# Patient Record
Sex: Female | Born: 1965
Health system: Southern US, Community
[De-identification: ages and names within clinical notes are randomized; demographics above are authoritative.]

## PROBLEM LIST (undated history)

## (undated) DIAGNOSIS — I10 Essential (primary) hypertension: Secondary | ICD-10-CM

## (undated) DIAGNOSIS — Z5189 Encounter for other specified aftercare: Secondary | ICD-10-CM

## (undated) DIAGNOSIS — M8580 Other specified disorders of bone density and structure, unspecified site: Secondary | ICD-10-CM

## (undated) DIAGNOSIS — Z9889 Other specified postprocedural states: Secondary | ICD-10-CM

## (undated) DIAGNOSIS — F419 Anxiety disorder, unspecified: Secondary | ICD-10-CM

## (undated) DIAGNOSIS — E079 Disorder of thyroid, unspecified: Secondary | ICD-10-CM

## (undated) DIAGNOSIS — G43109 Migraine with aura, not intractable, without status migrainosus: Secondary | ICD-10-CM

## (undated) HISTORY — PX: BREAST SURGERY: SHX581

## (undated) HISTORY — DX: Encounter for other specified aftercare: Z51.89

## (undated) HISTORY — DX: Disorder of thyroid, unspecified: E07.9

## (undated) HISTORY — DX: Other specified disorders of bone density and structure, unspecified site: M85.80

## (undated) HISTORY — PX: REDUCTION MAMMAPLASTY: SUR839

## (undated) HISTORY — DX: Other specified postprocedural states: Z98.890

## (undated) HISTORY — DX: Essential (primary) hypertension: I10

## (undated) HISTORY — DX: Migraine with aura, not intractable, without status migrainosus: G43.109

## (undated) HISTORY — DX: Anxiety disorder, unspecified: F41.9

## (undated) HISTORY — PX: ABDOMINAL HYSTERECTOMY: SHX81

---

## 1996-08-16 HISTORY — PX: BREAST LUMPECTOMY: SHX2

## 1999-03-05 ENCOUNTER — Other Ambulatory Visit: Admission: RE | Admit: 1999-03-05 | Discharge: 1999-03-05 | Payer: Self-pay | Admitting: Obstetrics and Gynecology

## 2000-02-09 ENCOUNTER — Other Ambulatory Visit: Admission: RE | Admit: 2000-02-09 | Discharge: 2000-02-09 | Payer: Self-pay | Admitting: Obstetrics and Gynecology

## 2000-09-16 ENCOUNTER — Inpatient Hospital Stay (HOSPITAL_COMMUNITY): Admission: AD | Admit: 2000-09-16 | Discharge: 2000-09-20 | Payer: Self-pay | Admitting: Obstetrics and Gynecology

## 2000-09-21 ENCOUNTER — Encounter: Admission: RE | Admit: 2000-09-21 | Discharge: 2000-10-21 | Payer: Self-pay | Admitting: Obstetrics and Gynecology

## 2001-02-15 ENCOUNTER — Other Ambulatory Visit: Admission: RE | Admit: 2001-02-15 | Discharge: 2001-02-15 | Payer: Self-pay | Admitting: Obstetrics and Gynecology

## 2002-01-30 ENCOUNTER — Other Ambulatory Visit: Admission: RE | Admit: 2002-01-30 | Discharge: 2002-01-30 | Payer: Self-pay | Admitting: Obstetrics and Gynecology

## 2003-02-22 ENCOUNTER — Other Ambulatory Visit: Admission: RE | Admit: 2003-02-22 | Discharge: 2003-02-22 | Payer: Self-pay | Admitting: Obstetrics and Gynecology

## 2004-01-30 ENCOUNTER — Encounter (INDEPENDENT_AMBULATORY_CARE_PROVIDER_SITE_OTHER): Payer: Self-pay | Admitting: *Deleted

## 2004-01-30 ENCOUNTER — Ambulatory Visit (HOSPITAL_COMMUNITY): Admission: RE | Admit: 2004-01-30 | Discharge: 2004-01-30 | Payer: Self-pay | Admitting: Obstetrics and Gynecology

## 2004-01-30 ENCOUNTER — Ambulatory Visit (HOSPITAL_BASED_OUTPATIENT_CLINIC_OR_DEPARTMENT_OTHER): Admission: RE | Admit: 2004-01-30 | Discharge: 2004-01-30 | Payer: Self-pay | Admitting: Obstetrics and Gynecology

## 2004-04-30 ENCOUNTER — Other Ambulatory Visit: Admission: RE | Admit: 2004-04-30 | Discharge: 2004-04-30 | Payer: Self-pay | Admitting: Obstetrics and Gynecology

## 2005-01-20 ENCOUNTER — Ambulatory Visit: Payer: Self-pay | Admitting: Internal Medicine

## 2005-03-08 ENCOUNTER — Ambulatory Visit: Payer: Self-pay | Admitting: Internal Medicine

## 2005-05-27 ENCOUNTER — Other Ambulatory Visit: Admission: RE | Admit: 2005-05-27 | Discharge: 2005-05-27 | Payer: Self-pay | Admitting: Obstetrics and Gynecology

## 2005-08-12 ENCOUNTER — Ambulatory Visit: Payer: Self-pay | Admitting: Internal Medicine

## 2005-08-20 ENCOUNTER — Ambulatory Visit: Payer: Self-pay | Admitting: Internal Medicine

## 2005-08-27 ENCOUNTER — Ambulatory Visit: Payer: Self-pay | Admitting: Internal Medicine

## 2005-11-02 ENCOUNTER — Ambulatory Visit: Payer: Self-pay | Admitting: Internal Medicine

## 2005-12-24 ENCOUNTER — Ambulatory Visit: Payer: Self-pay | Admitting: Family Medicine

## 2006-07-25 ENCOUNTER — Ambulatory Visit: Payer: Self-pay | Admitting: Internal Medicine

## 2006-08-25 ENCOUNTER — Encounter (INDEPENDENT_AMBULATORY_CARE_PROVIDER_SITE_OTHER): Payer: Self-pay | Admitting: Specialist

## 2006-08-25 ENCOUNTER — Ambulatory Visit (HOSPITAL_COMMUNITY): Admission: RE | Admit: 2006-08-25 | Discharge: 2006-08-25 | Payer: Self-pay | Admitting: Obstetrics and Gynecology

## 2006-10-17 ENCOUNTER — Ambulatory Visit: Payer: Self-pay | Admitting: Internal Medicine

## 2007-05-01 ENCOUNTER — Ambulatory Visit: Payer: Self-pay | Admitting: Internal Medicine

## 2007-05-01 LAB — CONVERTED CEMR LAB: Beta hcg, urine, semiquantitative: NEGATIVE

## 2007-05-04 ENCOUNTER — Telehealth (INDEPENDENT_AMBULATORY_CARE_PROVIDER_SITE_OTHER): Payer: Self-pay | Admitting: *Deleted

## 2007-10-19 ENCOUNTER — Ambulatory Visit: Payer: Self-pay | Admitting: Internal Medicine

## 2007-11-08 ENCOUNTER — Telehealth: Payer: Self-pay | Admitting: Internal Medicine

## 2007-11-09 ENCOUNTER — Ambulatory Visit: Payer: Self-pay | Admitting: Internal Medicine

## 2007-11-27 ENCOUNTER — Telehealth (INDEPENDENT_AMBULATORY_CARE_PROVIDER_SITE_OTHER): Payer: Self-pay | Admitting: *Deleted

## 2008-10-14 ENCOUNTER — Inpatient Hospital Stay (HOSPITAL_COMMUNITY): Admission: AD | Admit: 2008-10-14 | Discharge: 2008-10-17 | Payer: Self-pay | Admitting: Obstetrics and Gynecology

## 2008-10-15 ENCOUNTER — Encounter: Payer: Self-pay | Admitting: Obstetrics and Gynecology

## 2008-12-03 ENCOUNTER — Encounter: Admission: RE | Admit: 2008-12-03 | Discharge: 2008-12-12 | Payer: Self-pay | Admitting: Obstetrics and Gynecology

## 2009-02-12 ENCOUNTER — Ambulatory Visit: Payer: Self-pay | Admitting: Internal Medicine

## 2009-02-12 DIAGNOSIS — I10 Essential (primary) hypertension: Secondary | ICD-10-CM | POA: Insufficient documentation

## 2009-02-14 ENCOUNTER — Ambulatory Visit: Payer: Self-pay | Admitting: Internal Medicine

## 2009-02-14 DIAGNOSIS — L0201 Cutaneous abscess of face: Secondary | ICD-10-CM | POA: Insufficient documentation

## 2009-02-14 DIAGNOSIS — L03211 Cellulitis of face: Secondary | ICD-10-CM

## 2009-04-22 ENCOUNTER — Ambulatory Visit: Payer: Self-pay | Admitting: Internal Medicine

## 2009-04-22 ENCOUNTER — Encounter (INDEPENDENT_AMBULATORY_CARE_PROVIDER_SITE_OTHER): Payer: Self-pay | Admitting: *Deleted

## 2009-04-22 DIAGNOSIS — J019 Acute sinusitis, unspecified: Secondary | ICD-10-CM | POA: Insufficient documentation

## 2009-05-30 ENCOUNTER — Ambulatory Visit: Payer: Self-pay | Admitting: Internal Medicine

## 2009-07-14 ENCOUNTER — Encounter: Payer: Self-pay | Admitting: Internal Medicine

## 2009-07-23 ENCOUNTER — Telehealth (INDEPENDENT_AMBULATORY_CARE_PROVIDER_SITE_OTHER): Payer: Self-pay | Admitting: *Deleted

## 2009-08-16 HISTORY — PX: TOTAL THYROIDECTOMY: SHX2547

## 2009-11-06 ENCOUNTER — Ambulatory Visit: Payer: Self-pay | Admitting: Internal Medicine

## 2009-11-13 ENCOUNTER — Encounter: Admission: RE | Admit: 2009-11-13 | Discharge: 2009-11-13 | Payer: Self-pay | Admitting: Emergency Medicine

## 2009-11-18 ENCOUNTER — Other Ambulatory Visit: Admission: RE | Admit: 2009-11-18 | Discharge: 2009-11-18 | Payer: Self-pay | Admitting: Interventional Radiology

## 2009-11-18 ENCOUNTER — Encounter: Admission: RE | Admit: 2009-11-18 | Discharge: 2009-11-18 | Payer: Self-pay | Admitting: Emergency Medicine

## 2009-12-04 ENCOUNTER — Encounter: Payer: Self-pay | Admitting: Internal Medicine

## 2009-12-17 ENCOUNTER — Encounter: Admission: RE | Admit: 2009-12-17 | Discharge: 2009-12-17 | Payer: Self-pay | Admitting: Surgery

## 2009-12-17 ENCOUNTER — Other Ambulatory Visit: Admission: RE | Admit: 2009-12-17 | Discharge: 2009-12-17 | Payer: Self-pay | Admitting: Interventional Radiology

## 2010-04-09 ENCOUNTER — Encounter (INDEPENDENT_AMBULATORY_CARE_PROVIDER_SITE_OTHER): Payer: Self-pay | Admitting: Surgery

## 2010-04-09 ENCOUNTER — Observation Stay (HOSPITAL_COMMUNITY): Admission: RE | Admit: 2010-04-09 | Discharge: 2010-04-11 | Payer: Self-pay | Admitting: Surgery

## 2010-06-11 ENCOUNTER — Ambulatory Visit: Payer: Self-pay | Admitting: Internal Medicine

## 2010-09-15 NOTE — Assessment & Plan Note (Signed)
Summary: FLU SHOT/RH........  Nurse Visit   Allergies: 1)  ! Codeine  Orders Added: 1)  Admin 1st Vaccine [90471] 2)  Flu Vaccine 107yrs + [84696] Flu Vaccine Consent Questions     Do you have a history of severe allergic reactions to this vaccine? no    Any prior history of allergic reactions to egg and/or gelatin? no    Do you have a sensitivity to the preservative Thimersol? no    Do you have a past history of Guillan-Barre Syndrome? no    Do you currently have an acute febrile illness? no    Have you ever had a severe reaction to latex? no    Vaccine information given and explained to patient? yes    Are you currently pregnant? no    Lot Number:[id number]   Exp Date:02/13/2011   Site Given  Left Deltoid IM

## 2010-09-15 NOTE — Assessment & Plan Note (Signed)
Summary: sinus infection//lch   Vital Signs:  Patient profile:   45 year old female Weight:      129.8 pounds Temp:     98.3 degrees F oral Pulse rate:   84 / minute Resp:     14 per minute BP sitting:   120 / 72  (left arm)  Vitals Entered By: Shonna Chock (November 06, 2009 12:21 PM) CC: Sinus Infection: cough (productive-discolored), head feels full, congestion, and achy since Sunday Comments REVIEWED MED LIST, PATIENT AGREED DOSE AND INSTRUCTION CORRECT    CC:  Sinus Infection: cough (productive-discolored), head feels full, congestion, and and achy since Sunday.  History of Present Illness: Onset since 11/02/2009 of head congestion with chills & am cough with thick , green sputum. Rx: NSAIDS. Flu shot in Fall  Allergies: 1)  ! Codeine  Review of Systems General:  Denies fever and sweats. ENT:  Complains of earache, nasal congestion, and sinus pressure; denies ear discharge and sore throat; Facial pain , frontal headaches with clear D/C. ST improved. Resp:  Denies chest pain with inspiration, coughing up blood, shortness of breath, and wheezing. GU:  Denies discharge and dysuria; Suprapubic tenderness with urination.  Physical Exam  General:  Thin,well-nourished,in no acute distress; alert,appropriate and cooperative throughout examination Ears:  External ear exam shows no significant lesions or deformities.  Otoscopic examination reveals clear canals, tympanic membranes are intact bilaterally without bulging, retraction, inflammation or discharge. Hearing is grossly normal bilaterally. Nose:  External nasal examination shows no deformity or inflammation. Nasal mucosa are pink and moist without lesions or exudates.Hyponasal speech Mouth:  Oral mucosa and oropharynx without lesions or exudates.  Teeth in good repair. Lungs:  Normal respiratory effort, chest expands symmetrically. Lungs are clear to auscultation, no crackles or wheezes. Heart:  Normal rate and regular rhythm. S1  and S2 normal without gallop, murmur, click, rub .S4 Cervical Nodes:  Shotty post LA  Axillary Nodes:  No palpable lymphadenopathy   Impression & Recommendations:  Problem # 1:  SINUSITIS- ACUTE-NOS (ICD-461.9)  The following medications were removed from the medication list:    Clarithromycin 500 Mg Xr24h-tab (Clarithromycin) .Marland Kitchen... 2 once daily with food Her updated medication list for this problem includes:    Smz-tmp Ds 800-160 Mg Tabs (Sulfamethoxazole-trimethoprim) .Marland Kitchen... 1 two times a day with 8 oz water    Fluticasone Propionate 50 Mcg/act Susp (Fluticasone propionate) .Marland Kitchen... 1 spray two times a day  Problem # 2:  BRONCHITIS-ACUTE (ICD-466.0)  The following medications were removed from the medication list:    Clarithromycin 500 Mg Xr24h-tab (Clarithromycin) .Marland Kitchen... 2 once daily with food Her updated medication list for this problem includes:    Smz-tmp Ds 800-160 Mg Tabs (Sulfamethoxazole-trimethoprim) .Marland Kitchen... 1 two times a day with 8 oz water  Complete Medication List: 1)  Multi-vitamin 1 Tab Qd  2)  Chew Calicum 1 Qd  3)  Xanax 0.5 Mg Tabs (Alprazolam) .... 1/2-1 by mouth at bedtime 4)  Smz-tmp Ds 800-160 Mg Tabs (Sulfamethoxazole-trimethoprim) .Marland Kitchen.. 1 two times a day with 8 oz water 5)  Fluticasone Propionate 50 Mcg/act Susp (Fluticasone propionate) .Marland Kitchen.. 1 spray two times a day  Patient Instructions: 1)  Neti pot once daily until sinuses clear. 2)  Drink as much fluid as you can tolerate for the next few days. Prescriptions: FLUTICASONE PROPIONATE 50 MCG/ACT SUSP (FLUTICASONE PROPIONATE) 1 spray two times a day  #1 x 5   Entered and Authorized by:   Marga Melnick MD   Signed by:  Marga Melnick MD on 11/06/2009   Method used:   Faxed to ...       [facility] [id number]* (retail)       [address] [place], [place]  [address]       Ph: [phone]       Fax: [phone]   RxID:   [id number] SMZ-TMP DS 800-160 MG TABS (SULFAMETHOXAZOLE-TRIMETHOPRIM) 1  two times a day with 8 oz water  #20 x 0   Entered and Authorized by:   Marga Melnick MD   Signed by:   Marga Melnick MD on 11/06/2009   Method used:   Faxed to ...       [facility] [address] #[id number]* (retail)       [address] [place], [place]  [address]       Ph: [phone]       Fax: [phone]   RxID:   [id number]

## 2010-09-15 NOTE — Letter (Signed)
Summary: [facility]   Imported By: Lanelle Bal 12/23/2009 13:47:50  _____________________________________________________________________  External Attachment:    Type:   Image     Comment:   External Document  Appended Document: [facility] I reviewed Path reports; benign thyroid adenoma . Benignity verified by Specialist in [place].

## 2010-10-30 LAB — COMPREHENSIVE METABOLIC PANEL
ALT: 21 U/L (ref 0–35)
AST: 20 U/L (ref 0–37)
Albumin: 4.4 g/dL (ref 3.5–5.2)
BUN: 10 mg/dL (ref 6–23)
GFR calc Af Amer: >60 mL/min (ref >60)
GFR calc non Af Amer: >60 mL/min (ref >60)
Glucose, Bld: 56 mg/dL — ABNORMAL LOW (ref 70–99)
Potassium: 3.9 mEq/L (ref 3.5–5.1)
Total Protein: 7.1 g/dL (ref 6.0–8.3)

## 2010-10-30 LAB — CBC
HCT: 43.3 % (ref 36.0–46.0)
Hemoglobin: 14.7 g/dL (ref 12.0–15.0)
MCH: 28.8 pg (ref 26.0–34.0)
MCHC: 33.9 g/dL (ref 30.0–36.0)
MCV: 85 fL (ref 78.0–100.0)
Platelets: 163 10*3/uL (ref 150–400)

## 2010-10-30 LAB — DIFFERENTIAL
Lymphocytes Relative: 19 % (ref 12–46)
Lymphs Abs: 1.6 10*3/uL (ref 0.7–4.0)
Monocytes Relative: 4 % (ref 3–12)

## 2010-10-30 LAB — CALCIUM: Calcium: 8.3 mg/dL — ABNORMAL LOW (ref 8.4–10.5)

## 2010-11-26 LAB — CBC
HCT: 36.8 % (ref 36.0–46.0)
Hemoglobin: 12.5 g/dL (ref 12.0–15.0)
MCHC: 34 g/dL (ref 30.0–36.0)
MCV: 90.6 fL (ref 78.0–100.0)
Platelets: 182 10*3/uL (ref 150–400)
RBC: 4.07 MIL/uL (ref 3.87–5.11)

## 2010-11-26 LAB — RPR: RPR Ser Ql: NONREACTIVE

## 2010-11-26 LAB — TYPE AND SCREEN: ABO/RH(D): A POS

## 2010-11-26 LAB — ABO/RH: ABO/RH(D): A POS

## 2010-12-29 NOTE — H&P (Signed)
Olivia Page, Olivia Page              ACCOUNT NO.:  [id number]   MEDICAL RECORD NO.:  [id number]        PATIENT TYPE:  WINP   LOCATION:                                [facility]   PHYSICIAN:  Guy Sandifer. Henderson Cloud, M.D.      DATE OF BIRTH:   DATE OF ADMISSION:  10/14/2008  DATE OF DISCHARGE:                              HISTORY & PHYSICAL   CHIEF COMPLAINT:  Placenta previa with bleeding.   HISTORY OF PRESENT ILLNESS:  The patient is a 45 year old married white  female G2, P18 with an EDC of November 17, 2008 consistent with    Dictation ends here, incomplete   Finished in separate dictation      Fayrene Fearing E. Henderson Cloud, M.D.  Electronically Signed     JET/MEDQ  D:  10/14/2008  T:  10/14/2008  Job:  [id number]

## 2010-12-29 NOTE — H&P (Signed)
Olivia Page, Olivia Page              ACCOUNT NO.:  [id number]   MEDICAL RECORD NO.:  [id number]          PATIENT TYPE:  INP   LOCATION:  9156                          [facility]ACILITY:  [facility]   PHYSICIAN:  Guy Sandifer. Henderson Cloud, M.D. DATE OF BIRTH:  1966-02-20   DATE OF ADMISSION:  10/14/2008  DATE OF DISCHARGE:                              HISTORY & PHYSICAL   CHIEF COMPLAINT:  Placenta previa.   HISTORY OF PRESENT ILLNESS:  This patient is a 45 year old married white  female G2, P54 with an EDC of November 17, 2008 consistent with her  conception date placing her at 35-1/7 weeks estimated gestational age.  Prenatal care has been complicated by the advanced maternal age with a  normal first trimester screen and an E-coli urinary tract infection in  the first trimester that was treated.  Ultrasound documented a posterior  left lateral placenta previa.  She presents to the office today with  complaint of bright red blood on the tissue yesterday.  She has had none  so far today.  The evaluation the office includes an ultrasound which  reveals a biophysical profile of 8/8.  The baby is in a transverse  position.  Placenta previa remains.  Estimated fetal weight is 2057  grams (5 pounds 14 ounces) at the 71st percentile.  AFI is 13.2 in the  45th percentile.  Cervical length was 5.1 cm with no funneling.  The  patient is being admitted for close observation and further evaluation.   For past medical history, past surgical history, family history,  obstetric history, social history see prenatal history and physical.   MEDICATIONS:  Prenatal vitamins.   ALLERGIES:  CODEINE.   PHYSICAL EXAMINATION:  VITAL SIGNS:  Height 5 feet 4-1/2 inches, weight  170.2 pounds, blood pressure 120/70.  LUNGS:  Clear to auscultation.  HEART:  Regular rate and rhythm.  ABDOMEN:  Gravid. Uterus soft, nontender.  Cervix not examined.  EXTREMITIES:  Grossly within normal limits.  NEUROLOGICAL:  Exam grossly within normal  limits.   ASSESSMENT:  Placenta previa at 35-1/7 weeks with bleeding yesterday.   PLAN:  Will admit external fetal monitors, intravenous fluids, and will  obtain maternal fetal medicine consultation.  We will consider  amniocentesis and C-section for delivery pending maternal fetal medicine  recommendations.      Guy Sandifer Henderson Cloud, M.D.  Electronically Signed    JET/MEDQ  D:  10/14/2008  T:  10/14/2008  Job:  [id number]

## 2011-01-01 NOTE — Op Note (Signed)
[facility]  Patient:    Olivia Page, Olivia Page                     MRN: [id number] Proc. Date: 09/16/00 Adm. Date:  [id number] Attending:  Marcelle Overlie                           Operative Report  PREOPERATIVE DIAGNOSES:       1. Intrauterine pregnancy at 39 weeks.                               2. Breech presentation.  POSTOPERATIVE DIAGNOSES:      1. Intrauterine pregnancy at 39 weeks.                               2. Breech presentation.  OPERATION:                    Primary low transverse cesarean section.  SURGEON:                      Marcelle Overlie, M.D.  ASSISTANT:                    Miguel Aschoff, M.D.  ANESTHESIA:                   Spinal.  ESTIMATED BLOOD LOSS:         500 cc.  FINDINGS:                     Female infant at frank presentation.  Apgars 9 at one minute and 9 at five minutes, weighing 7 pounds and 8 ounces with normal adnexa.  DESCRIPTION OF PROCEDURE:     The patient was taken to the operating room where she was given a spinal without difficulty.  She was then placed in the dorsal supine position with a leftward tilt.  The abdomen was prepped and draped in the usual sterile fashion after a Foley catheter was placed in the bladder.  Using the scalpel, a low transverse incision was made and carried down to the fascia using electrocautery with good hemostasis.  The fascia was scored in the midline and extended laterally using Mayo scissors.  A Pfannenstiel incision was then created by dissecting the rectus muscles away from the fascia using electrocautery with good hemostasis.  The midline was identified and the rectus muscles were separated.  The peritoneum was then entered bluntly and the peritoneal incision was then stretched.  The bladder blade was inserted.  The lower uterine segment was identified and the bladder flap was then created sharply and then digitally.  The bladder blade was then readjusted.  Using the  scalpel, a low transverse incision was made in the uterus and upon entering into the amniotic cavity, the fluid was noted to be clear.  Using the hemostat, the incision was then extended laterally.  Then, the baby was noted to be in frank breech presentation, and was delivered without any difficulty. The baby was a female infant with Apgars of 9 at one minute and 9 at five minutes, and weighed 7 pounds and 8 ounces.  The cord was clamped and cut.  The baby was handed to the waiting pediatrician.  Cord blood was obtained.  The placenta was then manually removed and noted to be intact.  The uterus was cleared of all clots and debris.  The uterine incision was closed in a single layer using 0 chromic in a continuous running locked stitch, and was inspected and noted to be hemostatic.  The adnexa were palpated and were normal.  The peritoneum was closed using 0 Vicryl in a continuous running stitch.  The rectus muscles were reapproximated using a 7-0 Vicryl.  The fascia was closed using 0 Vicryl in a continuous running stitch starting at each corner, and meeting in the midline.  After inspection of the subcutaneous layer, the skin was closed with staples.  All sponge, lap, and instrument counts were correct x 2.  The patient went to the recovery room in stable condition.  Complications none.  Drains Foley. DD:  09/16/00 TD:  09/17/00 Job: [id number]

## 2011-01-01 NOTE — Op Note (Signed)
NAME:  Olivia Page, Olivia Page                        ACCOUNT NO.:  [id number]   MEDICAL RECORD NO.:  [id number]                   PATIENT TYPE:  AMB   LOCATION:  DSC                                  FACILITY:  [facility]   PHYSICIAN:  Sandria Bales. Ezzard Standing, M.D.               DATE OF BIRTH:  Sep 06, 1965   DATE OF PROCEDURE:  01/30/2004  DATE OF DISCHARGE:                                 OPERATIVE REPORT   PREOPERATIVE DIAGNOSIS:  Mass, right shoulder, approximately 3 cm in size.   POSTOPERATIVE DIAGNOSIS:  Mass, right shoulder, approximately 3 cm in size,  apparent lipoma.   PROCEDURE:  Excision of mass, right shoulder.   SURGEON:  Sandria Bales. Ezzard Standing, M.D.   ANESTHESIA:  About 6 mL of 1% Xylocaine with epinephrine.   COMPLICATIONS:  None.   INDICATION FOR PROCEDURE:  Ms. Filippone is a 45 year old white female who has  had a mass of her right shoulder, which has gotten slightly larger and  causing increasing discomfort.   She now comes for excision of this mass.   The patient was placed in the left lateral decubitus position, her right  shoulder prepped with Betadine solution.  I marked the mass or the lipoma,  infiltrated with 1% Xylocaine about 6 mL, made an incision, and removed the  mass as one en bloc piece of tissue.   I closed the wound with 5-0 Vicryl, painted the wound with tincture of  Benzoin, and steri-stripped it.  The patient tolerated the procedure well  and will be seen back in one week for wound check, call for any problems.                                               Sandria Bales. Ezzard Standing, M.D.    DHN/MEDQ  D:  01/30/2004  T:  01/30/2004  Job:  [id number]   cc:   Titus Dubin. Alwyn Ren, M.D. [facility]   Michelle L. Vincente Poli, M.D.  [address], [address] [place]  [place] [address]  Fax: [phone]

## 2011-01-01 NOTE — Discharge Summary (Signed)
Olivia Page, Olivia Page              ACCOUNT NO.:  [id number]   MEDICAL RECORD NO.:  [id number]          PATIENT TYPE:  INP   LOCATION:  9156                          [facility]   PHYSICIAN:  Michelle L. Grewal, M.D.DATE OF BIRTH:  1966-01-04   DATE OF ADMISSION:  10/14/2008  DATE OF DISCHARGE:  10/17/2008                               DISCHARGE SUMMARY   ADMITTING DIAGNOSES:  1. Intrauterine pregnancy at 35-1/7th weeks' estimated gestational      age.  2. Vaginal bleeding.   DISCHARGE DIAGNOSES:  1. Complete placenta previa.  2. Suspect placenta percreta.  3. Intrauterine pregnancy at 35-3/7th weeks' estimated gestational      age.   REASON FOR ADMISSION:  Please see dictated H&P.   HOSPITAL COURSE:  The patient is 45 year old white married female  gravida 2, para 1 that presented to [facility] at 35-1/7  weeks' estimated gestational age with complaints of vaginal bleeding.  The patient did undergo an ultrasound in the office which revealed a  biophysical profile score of 8/8.  Baby was noted to be in transverse  position.  Placenta previa was observed.  Estimated fetal weight was  2057 grams, which was the 71st percentile.  Amniotic fluid index was  13.2.  Cervical length was 5.1 with no funneling.  The patient was now  admitted for close observation and further evaluation.  Hence the  patient did have Maternal Fetal Medicine consult, at which time an MRI  was performed.  There was some suggestion that the patient did have a  probable percreta, which was adjacent to the bladder.  After a long  discussion with the patient, decision was made to transfer the patient  to [facility] for admission and delivery.   CONDITION ON DISCHARGE:  Stable.   DIET:  Regular as tolerated.   ACTIVITY:  Total bedrest.   DISCHARGE MEDICATIONS:  Prenatal vitamins 1 p.o. daily.     Julio Sicks, N.P.      Stann Mainland. Vincente Poli, M.D.  Electronically Signed   CC/MEDQ  D:   11/18/2008  T:  11/18/2008  Job:  [id number]

## 2011-01-01 NOTE — Op Note (Signed)
NAMEZYAH, GOMM              ACCOUNT NO.:  [id number]   MEDICAL RECORD NO.:  [id number]          PATIENT TYPE:  AMB   LOCATION:  SDC                           [facility]   PHYSICIAN:  Michelle L. Grewal, M.D.DATE OF BIRTH:  [birth year]   DATE OF PROCEDURE:  08/25/2006  DATE OF DISCHARGE:                               OPERATIVE REPORT   PREOPERATIVE DIAGNOSES:  1. Dysfunctional uterine bleeding  2. Endometrial polyp.   POSTOPERATIVE DIAGNOSES:  1. Dysfunctional uterine bleeding  2. Endometrial polyp.   PROCEDURE:  Dilatation and curettage.   SURGEON:  Michelle L. Vincente Poli, M.D.   ANESTHESIA:  Mask with local.   SPECIMENS:  Uterine curettings.   ESTIMATED BLOOD LOSS:  Minimal.   DESCRIPTION OF PROCEDURE:  Patient is taken to the operating room.  She  is given anesthesia without difficulty.  She is prepped and draped in  the usual sterile fashion.  An in and out catheter was used to empty the  bladder.  A speculum was inserted into the vagina.  The cervix was  grasped with a tenaculum and a paracervical block is performed in a  standard fashion.  The uterus is sounded and the cervical os is gently  dilated.  A sharp curette is inserted into the uterus.  The uterus is  thoroughly curetted with a moderate amount of tissue.  Some of it did  appear to be polypoid.  Polyp forceps were used to remove the remainder  of the tissue.  A final sharp curettage is performed and the uterine  cavity is clean.  All tissue sent to pathology.  The patient had minimal  bleeding at the end of the procedure.  All instruments were removed from  the vagina.  Sponge, lap and instrument counts correct x2.  Patient went  to the recovery room in stable condition.      Michelle L. Vincente Poli, M.D.  Electronically Signed     MLG/MEDQ  D:  08/25/2006  T:  08/25/2006  Job:  [id number]

## 2011-01-01 NOTE — Discharge Summary (Signed)
[facility]  Patient:    Olivia Page, Olivia Page                     MRN: [id number] Adm. Date:  [id number] Disc. Date: [id number] Attending:  Marcelle Overlie Dictator:   Leilani Able, P.A.                           Discharge Summary  FINAL DIAGNOSES:              Intrauterine pregnancy at [redacted] weeks gestation, breech presentation.  PROCEDURE:                    Primary low transverse cesarean section.  SURGEON:                      Dr. Marcelle Overlie.  ASSISTANT:                    Dr. Miguel Aschoff.  COMPLICATIONS:                None.  HISTORY:                      This 45 year old G2, P0-0-1-0 presents at [redacted] weeks gestation for a primary low transverse cesarean section secondary to breech presentation.  Patients prenatal course had been uncomplicated except for a positive group B strep culture which was performed in the office. Patient was taken to the operating room on September 16, 2000 by Dr. Marcelle Overlie where a primary low transverse cesarean section was performed with the delivery of a 7 pound 8 ounce infant with Apgars of 9 and 9.  Delivery went without complication.  Patients postoperative course complicated by some emotional instability, fatigue, and some incisional pain.  Patient was kept until postoperative day #4.  Baby was not circumcised secondary to an abnormal meatus and was waiting for pediatric input.  Patient was felt ready for discharge on postoperative day #4.  She was sent home on a regular diet.  Told to decrease activities.  Told to continue prenatal vitamins and FeSo4.  Was given Motrin 600 mg one q.6h. as needed for pain and Tylox one to two q.4h. as needed for pain.  Patient was to follow-up in the office in four weeks.  DISCHARGE LABORATORIES:       Hemoglobin 12.0, white blood cell count 11.9. DD:  10/12/00 TD:  10/12/00 Job: [id number]

## 2011-08-02 ENCOUNTER — Ambulatory Visit (INDEPENDENT_AMBULATORY_CARE_PROVIDER_SITE_OTHER): Payer: 59

## 2011-08-02 DIAGNOSIS — J40 Bronchitis, not specified as acute or chronic: Secondary | ICD-10-CM

## 2011-08-02 DIAGNOSIS — J018 Other acute sinusitis: Secondary | ICD-10-CM

## 2011-08-02 DIAGNOSIS — J029 Acute pharyngitis, unspecified: Secondary | ICD-10-CM

## 2011-08-02 DIAGNOSIS — B9789 Other viral agents as the cause of diseases classified elsewhere: Secondary | ICD-10-CM

## 2012-04-05 ENCOUNTER — Other Ambulatory Visit: Payer: Self-pay | Admitting: Obstetrics and Gynecology

## 2012-06-07 ENCOUNTER — Ambulatory Visit (INDEPENDENT_AMBULATORY_CARE_PROVIDER_SITE_OTHER): Payer: 59 | Admitting: Family Medicine

## 2012-06-07 VITALS — BP 144/90 | HR 81 | Temp 97.4°F | Resp 17 | Ht 65.0 in | Wt 143.0 lb

## 2012-06-07 DIAGNOSIS — H60399 Other infective otitis externa, unspecified ear: Secondary | ICD-10-CM

## 2012-06-07 DIAGNOSIS — R51 Headache: Secondary | ICD-10-CM

## 2012-06-07 DIAGNOSIS — H6090 Unspecified otitis externa, unspecified ear: Secondary | ICD-10-CM

## 2012-06-07 DIAGNOSIS — J329 Chronic sinusitis, unspecified: Secondary | ICD-10-CM

## 2012-06-07 DIAGNOSIS — R112 Nausea with vomiting, unspecified: Secondary | ICD-10-CM

## 2012-06-07 MED ORDER — KETOROLAC TROMETHAMINE 60 MG/2ML IM SOLN
60.0000 mg | Freq: Once | INTRAMUSCULAR | Status: AC
  Administered 2012-06-07: 60 mg via INTRAMUSCULAR

## 2012-06-07 MED ORDER — FLUTICASONE PROPIONATE 50 MCG/ACT NA SUSP: 2.0000 | Freq: Every day | NASAL | Status: DC

## 2012-06-07 MED ORDER — NEOMYCIN-POLYMYXIN-HC 3.5-10000-1 OT SOLN: 3.0000 [drp] | Freq: Four times a day (QID) | OTIC | Status: DC

## 2012-06-07 MED ORDER — PROMETHAZINE HCL 25 MG/ML IJ SOLN
25.0000 mg | Freq: Once | INTRAMUSCULAR | Status: AC
  Administered 2012-06-07: 25 mg via INTRAMUSCULAR

## 2012-06-07 MED ORDER — AZITHROMYCIN 250 MG PO TABS: ORAL_TABLET | ORAL | Status: DC

## 2012-06-07 MED ORDER — SUMATRIPTAN SUCCINATE 25 MG PO TABS
25.0000 mg | ORAL_TABLET | ORAL | Status: DC | PRN
Start: 2012-06-07 — End: 2013-01-18

## 2012-06-07 NOTE — Progress Notes (Signed)
  Subjective:    Patient ID: Olivia Page, female    DOB: Jul 25, 1966, 46 y.o.   MRN: [id number]  HPI   Olivia Page is a 46 y.o. female who presents for evaluation of headache. Symptoms began about 2 days ago. Generally, the headaches are constatnt . The headaches do not seem to be related to any time of the day.  Pt states that she woke up this am with severe headache. The headaches are usually throbbing and and aching and are located in frontal sinus area.  Pt states that she usually has migraine flares that are caused by bouts of sinusitis. This feels like usual flare. The patient rates her most severe headaches a 10 on a scale from 1 to 10. Recently, the headaches have been increasing in severity. Work attendance or other daily activities are affected by the headaches. Precipitating factors include: stress, URI symptoms and sinus issues. The headaches are usually not preceded by an aura. Associated neurologic symptoms: none apart from mild photophobia. The patient denies dizziness, loss of balance, speech difficulties and vision problems. Home treatment has included zofran with no improvement. Other history includes: recurrent headaches that have not been formally diagnosed. Family history includes headaches of unknown type in mother.    Review of Systems See HPI, otherwise 12 point ROS negative.     Objective:   Physical Exam  Constitutional:       Mildly ill appearing    HENT:  Head: Normocephalic and atraumatic.       R ear canal redness and tenderness to otoscopic evaluation.  +nasal erythema, rhinorrhea bilaterally, + post oropharyngeal erythema  + maxillary tenderness to palpation    Eyes: Conjunctivae normal are normal. Pupils are equal, round, and reactive to light.  Neck: Normal range of motion.  Cardiovascular: Normal rate and regular rhythm.   Pulmonary/Chest: Effort normal and breath sounds normal.  Abdominal: Soft. Bowel sounds are normal.  Musculoskeletal:  Normal range of motion.  Neurological: She is alert.  Skin: Skin is warm.       < 2 sec cap refill      Assessment & Plan:  Multifactorial migrainous flare.  Concominant sinusitis and otitis externa  Toradol 60mg  IM x1  Phenergan 25 mg IM x 1 Pt states that this combination has worked before in the past and requests this. imitrex rx as pt states that she has used this in the past.  Will Rx flonase and zpak for sinusitis. Pt declines using anithistamines 2/2 jitteriness.  Discussed general and neuro red flags for reevaluation. Would consider Head CT w/ and w/o contrast if this persists.  Follow up as needed.      The patient and/or caregiver has been counseled thoroughly with regard to treatment plan and/or medications prescribed including dosage, schedule, interactions, rationale for use, and possible side effects and they verbalize understanding. Diagnoses and expected course of recovery discussed and will return if not improved as expected or if the condition worsens. Patient and/or caregiver verbalized understanding.

## 2013-01-18 ENCOUNTER — Ambulatory Visit (INDEPENDENT_AMBULATORY_CARE_PROVIDER_SITE_OTHER): Payer: 59 | Admitting: Family Medicine

## 2013-01-18 VITALS — BP 124/72 | HR 90 | Temp 97.8°F | Resp 16 | Ht 65.5 in | Wt 139.2 lb

## 2013-01-18 DIAGNOSIS — R05 Cough: Secondary | ICD-10-CM

## 2013-01-18 DIAGNOSIS — J381 Polyp of vocal cord and larynx: Secondary | ICD-10-CM

## 2013-01-18 DIAGNOSIS — R059 Cough, unspecified: Secondary | ICD-10-CM

## 2013-01-18 DIAGNOSIS — R0981 Nasal congestion: Secondary | ICD-10-CM

## 2013-01-18 DIAGNOSIS — R52 Pain, unspecified: Secondary | ICD-10-CM

## 2013-01-18 MED ORDER — AZITHROMYCIN 250 MG PO TABS: ORAL_TABLET | ORAL | Status: DC

## 2013-01-18 NOTE — Patient Instructions (Addendum)
Drink plenty of fluids, and use tylenol/ ibuprofen as needed for aches and fatigue.  Let us know if you are not better in the next few days- Sooner if worse.

## 2013-01-18 NOTE — Progress Notes (Signed)
[facility] [address], [place] [address] [phone]  Date:  01/18/2013   Name:  Olivia Page   DOB:  1965-08-24   MRN:  [id number]  PCP:  Marga Melnick, MD    Chief Complaint: Chills and Nasal Congestion   History of Present Illness:  Olivia Page is a 47 y.o. very pleasant female patient who presents with the following:  She is here today with illness- she went swimming a few days ago, and the next day she noted ear ache and sinus pressure/ pain, she is blowing "green stuff" out of her nose.  "I taste sickness everytime I cough."   She has noted chills and aches, but has not had a fever.  She has noted a ST as well No GI symptoms.    She is taking synthroid, lisinopril and xanax as needed.    She used a goody powder yesterday.  Nothing so far today for fever such as tylenol or ibuprofen She has noted severe body aches.    She requests a zpack as other abx tend to bother her stomach.    Patient Active Problem List   Diagnosis Date Noted  . SINUSITIS- ACUTE-NOS 04/22/2009  . CELLULITIS AND ABSCESS OF FACE 02/14/2009  . HYPERTENSION 02/12/2009    Past Medical History  Diagnosis Date  . Anxiety   . Hypertension     Past Surgical History  Procedure Laterality Date  . Breast surgery      History  Substance Use Topics  . Smoking status: Never Smoker   . Smokeless tobacco: Not on file  . Alcohol Use: No    Family History  Problem Relation Age of Onset  . Hypertension Mother     Allergies  Allergen Reactions  . Sulfa Antibiotics Nausea And Vomiting  . Codeine     Medication list has been reviewed and updated.  Current Outpatient Prescriptions on File Prior to Visit  Medication Sig Dispense Refill  . ALPRAZolam (XANAX) 0.5 MG tablet Take 0.5 mg by mouth at bedtime as needed.      Marland Kitchen levothyroxine (SYNTHROID, LEVOTHROID) 125 MCG tablet Take 125 mcg by mouth daily.      Marland Kitchen lisinopril (PRINIVIL,ZESTRIL) 20 MG tablet Take  20 mg by mouth daily.      Marland Kitchen azithromycin (ZITHROMAX) 250 MG tablet Take 2 tabs PO x 1 dose, then 1 tab PO QD x 4 days  6 tablet  0  . fluticasone (FLONASE) 50 MCG/ACT nasal spray Place 2 sprays into the nose daily.  16 g  6  . neomycin-polymyxin-hydrocortisone (CORTISPORIN) otic solution Place 3 drops into the right ear 4 (four) times daily.  10 mL  0  . SUMAtriptan (IMITREX) 25 MG tablet Take 1 tablet (25 mg total) by mouth every 2 (two) hours as needed for migraine.  10 tablet  0   No current facility-administered medications on file prior to visit.    Review of Systems:  As per HPI- otherwise negative.   Physical Examination: Filed Vitals:   01/18/13 0810  BP: 124/72  Pulse: 98  Temp: 97.8 F (36.6 C)  Resp: 16   Filed Vitals:   01/18/13 0810  Height: 5' 5.5" (1.664 m)  Weight: 139 lb 3.2 oz (63.141 kg)   Body mass index is 22.8 kg/(m^2). Ideal Body Weight: Weight in (lb) to have BMI = 25: 152.2  GEN: WDWN, NAD, Non-toxic, A & O x 3 HEENT: Atraumatic, Normocephalic. Neck supple. No  masses, No LAD.  Bilateral TM wnl, oropharynx normal.  PEERL,EOMI.  Nasal cavity is congested Ears and Nose: No external deformity. CV: RRR, No M/G/R. No JVD. No thrill. No extra heart sounds. PULM: CTA B, no wheezes, crackles, rhonchi. No retractions. No resp. distress. No accessory muscle use. ABD: S, NT, ND, +BS. No rebound. No HSM. EXTR: No c/c/e NEURO Normal gait.  PSYCH: Normally interactive. Conversant. Not depressed or anxious appearing.  Calm demeanor.   Results for orders placed in visit on 01/18/13  POCT INFLUENZA A/B      Result Value Range   Influenza A, POC Negative     Influenza B, POC Negative      Assessment and Plan: Body aches - Plan: POCT Influenza A/B  Cough - Plan: azithromycin (ZITHROMAX) 250 MG tablet  Sinus congestion - Plan: azithromycin (ZITHROMAX) 250 MG tablet    Signed Abbe Amsterdam, MD

## 2013-04-20 ENCOUNTER — Other Ambulatory Visit: Payer: Self-pay | Admitting: Obstetrics and Gynecology

## 2013-04-25 ENCOUNTER — Other Ambulatory Visit: Payer: Self-pay | Admitting: Obstetrics and Gynecology

## 2013-04-25 DIAGNOSIS — R928 Other abnormal and inconclusive findings on diagnostic imaging of breast: Secondary | ICD-10-CM

## 2013-04-26 ENCOUNTER — Ambulatory Visit
Admission: RE | Admit: 2013-04-26 | Discharge: 2013-04-26 | Disposition: A | Discharge status: Home / Self Care | Payer: 59 | Source: Ambulatory Visit | Attending: Obstetrics and Gynecology | Admitting: Obstetrics and Gynecology

## 2013-04-26 DIAGNOSIS — R928 Other abnormal and inconclusive findings on diagnostic imaging of breast: Secondary | ICD-10-CM

## 2013-05-03 ENCOUNTER — Telehealth: Payer: Self-pay | Admitting: Genetic Counselor

## 2013-05-03 NOTE — Telephone Encounter (Signed)
PT RETURN CALL PER PT SHE WILL CALL BACK THE FIRST OF THE WEEK DUE TO WORK SCHEDULE TO SCHEDULE APPT.

## 2013-05-03 NOTE — Telephone Encounter (Signed)
LVOM FOR PT TO RETURN CALL IN RE TO GENETIC REFERRAL

## 2013-09-12 ENCOUNTER — Other Ambulatory Visit: Payer: Self-pay | Admitting: *Deleted

## 2013-09-12 ENCOUNTER — Other Ambulatory Visit: Payer: Self-pay

## 2013-09-12 DIAGNOSIS — E039 Hypothyroidism, unspecified: Secondary | ICD-10-CM | POA: Insufficient documentation

## 2013-09-13 ENCOUNTER — Ambulatory Visit: Payer: Self-pay | Admitting: Endocrinology

## 2013-09-24 ENCOUNTER — Other Ambulatory Visit: Payer: Self-pay | Admitting: *Deleted

## 2013-09-24 MED ORDER — LEVOTHYROXINE SODIUM 125 MCG PO TABS
125.0000 ug | ORAL_TABLET | Freq: Every day | ORAL | Status: DC
Start: 2013-09-24 — End: 2013-10-01

## 2013-09-25 ENCOUNTER — Other Ambulatory Visit: Payer: Self-pay

## 2013-09-25 ENCOUNTER — Other Ambulatory Visit (INDEPENDENT_AMBULATORY_CARE_PROVIDER_SITE_OTHER): Payer: 59

## 2013-09-25 DIAGNOSIS — E039 Hypothyroidism, unspecified: Secondary | ICD-10-CM

## 2013-09-25 LAB — T4, FREE: FREE T4: 1.08 ng/dL (ref 0.60–1.60)

## 2013-09-25 LAB — TSH: TSH: 0.24 u[IU]/mL — ABNORMAL LOW (ref 0.35–5.50)

## 2013-10-01 ENCOUNTER — Encounter: Payer: Self-pay | Admitting: Endocrinology

## 2013-10-01 ENCOUNTER — Ambulatory Visit (INDEPENDENT_AMBULATORY_CARE_PROVIDER_SITE_OTHER): Payer: 59 | Admitting: Endocrinology

## 2013-10-01 VITALS — BP 138/84 | HR 102 | Temp 97.6°F | Resp 14 | Ht 64.5 in | Wt 140.2 lb

## 2013-10-01 DIAGNOSIS — E890 Postprocedural hypothyroidism: Secondary | ICD-10-CM

## 2013-10-01 MED ORDER — LEVOTHYROXINE SODIUM 112 MCG PO TABS: 112.0000 ug | ORAL_TABLET | Freq: Every day | ORAL | Status: DC

## 2013-10-01 NOTE — Progress Notes (Signed)
Patient ID: Olivia DewKimberly B Page, female   DOB: [birth year], 48 y.o.   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        Also does not feel nervous or jittery.      The patient is taking the thyroid supplement very regularly in the morning before breakfast.  Not taking any calcium or iron supplements with the thyroid supplement.     Appointment on 09/25/2013  Component Date Value Ref Range Status  . TSH 09/25/2013 0.24* 0.35 - 5.50 uIU/mL Final  . Free T4 09/25/2013 1.08  0.60 - 1.60 ng/dL Final      Medication List       This list is accurate as of: 10/01/13 10:45 AM.  Always use your most recent med list.               ALPRAZolam 0.5 MG tablet  Commonly known as:  XANAX  Take 0.5 mg by mouth at bedtime as needed.     levothyroxine 125 MCG tablet  Commonly known as:  SYNTHROID, LEVOTHROID  Take 1 tablet (125 mcg total) by mouth daily.     lisinopril 20 MG tablet  Commonly known as:  PRINIVIL,ZESTRIL  Take 20 mg by mouth daily.        Allergies:  Allergies  Allergen Reactions  . Sulfa Antibiotics Nausea And Vomiting  . Codeine     Past Medical History  Diagnosis Date  . Anxiety   . Hypertension     Past Surgical History  Procedure Laterality Date  .  Breast surgery      Family History  Problem Relation Age of Onset  . Hypertension Mother     Social History:  reports that she has never smoked. She does not have any smokeless tobacco history on file. She reports that she does not drink alcohol or use illicit drugs.  REVIEW Of SYSTEMS:   She has a history of hypertension followed by PCP  She has a history of anxiety also   Examination:   BP 138/84  Pulse 102  Temp(Src) 97.6 F (36.4 C)  Resp 14  Ht 5' 4.5" (1.638 m)  Wt 140 lb 3.2 oz (63.594 kg)  BMI 23.70 kg/m2  SpO2 97%  Repeat pulse 86 GENERAL APPEARANCE: Alert and looks well   NECK: Thyroid is not palpable           NEUROLOGIC EXAM:  biceps reflexes show normal relaxation Skin: Not unusual dry, no peripheral edema     Assessment/Plan:    Hypothyroidism, Postsurgical with mild over replacement on current regimen of 125 mcg daily She will reduce her dose to 112 mcg; she can finish off her current prescription bottle with taking 6-1/2 tablets a week  Follow up in 4 months   Lugenia Assefa 10/01/2013, 10:45 AM

## 2013-10-01 NOTE — Patient Instructions (Signed)
Take 1/2 tab once a week till Rx for 125 out, then 112 daily

## 2013-12-11 ENCOUNTER — Ambulatory Visit: Payer: 59 | Admitting: Family Medicine

## 2014-01-05 ENCOUNTER — Ambulatory Visit (INDEPENDENT_AMBULATORY_CARE_PROVIDER_SITE_OTHER): Payer: 59 | Admitting: Internal Medicine

## 2014-01-05 VITALS — BP 124/76 | HR 86 | Temp 98.2°F | Resp 16 | Ht 64.0 in | Wt 145.2 lb

## 2014-01-05 DIAGNOSIS — J019 Acute sinusitis, unspecified: Secondary | ICD-10-CM

## 2014-01-05 MED ORDER — AZITHROMYCIN 250 MG PO TABS: ORAL_TABLET | ORAL | Status: DC

## 2014-01-05 NOTE — Progress Notes (Signed)
   Subjective:    Patient ID: Olivia Page, female    DOB: 1965/12/03, 48 y.o.   MRN: [id number]  HPI This chart was scribed for Olivia Sia, MD by Charline Bills, ED Scribe. The patient was seen in room 12. Patient's care was started at 8:02 AM.  HPI Comments: Olivia Page is a 48 y.o. female who [facility] complaining of sore throat onset a few days ago. She reports associated productive cough, congestion, chills, fever, HA, sinus pressure. Pt has a h/o environmental allergies but states that she can not take allergy medications due to palpitations. She reports recent sick contacts.  Pt had thyroid removed 4 years ago. She also has a h/o MRSA following a surgery 5 years ago. No current PCP.   Pt's dad had h/o HTN, stroke in 2009, not living. Pt's mom has h/o HTN, living.  Past Medical History  Diagnosis Date  . Anxiety   . Hypertension    Current Outpatient Prescriptions on File Prior to Visit  Medication Sig Dispense Refill  . ALPRAZolam (XANAX) 0.5 MG tablet Take 0.5 mg by mouth at bedtime as needed.      Marland Kitchen levothyroxine (SYNTHROID, LEVOTHROID) 112 MCG tablet Take 1 tablet (112 mcg total) by mouth daily.  90 tablet  1  . lisinopril (PRINIVIL,ZESTRIL) 20 MG tablet Take 20 mg by mouth daily.       No current facility-administered medications on file prior to visit.   Allergies  Allergen Reactions  . Sulfa Antibiotics Nausea And Vomiting  . Codeine    Review of Systems  Constitutional: Positive for fever and chills.  HENT: Positive for congestion, sinus pressure and sore throat.   Respiratory: Positive for cough.   Allergic/Immunologic: Positive for environmental allergies.  Neurological: Positive for headaches.     Objective:   Physical Exam  Nursing note and vitals reviewed. Constitutional: She is oriented to person, place, and time. She appears well-developed and well-nourished. No distress.  HENT:  Head: Normocephalic  and atraumatic.  Right Ear: Tympanic membrane normal.  Left Ear: Tympanic membrane normal.  Nose: Rhinorrhea (Purulent discharge) present. Right sinus exhibits maxillary sinus tenderness.  Mouth/Throat: Oropharynx is clear and moist.  Eyes: EOM are normal.  Neck: Neck supple.  Cardiovascular: Normal rate.   Pulmonary/Chest: Effort normal. No respiratory distress.  Musculoskeletal: Normal range of motion.  Lymphadenopathy:    She has no cervical adenopathy.  Neurological: She is alert and oriented to person, place, and time.  Skin: Skin is warm and dry.  Psychiatric: She has a normal mood and affect. Her behavior is normal.      Assessment & Plan:  Sinusitis following exp to viral URI  Meds ordered this encounter  Medications  . azithromycin (ZITHROMAX) 250 MG tablet----usually works for her    Sig: As packaged    Dispense:  6 tablet    Refill:  0     I personally performed the services described in this documentation, which was scribed in my presence. The recorded information has been reviewed and is accurate.

## 2014-01-24 ENCOUNTER — Telehealth: Payer: Self-pay | Admitting: Endocrinology

## 2014-01-24 ENCOUNTER — Other Ambulatory Visit: Payer: Self-pay | Admitting: *Deleted

## 2014-01-24 MED ORDER — LEVOTHYROXINE SODIUM 112 MCG PO TABS: 112.0000 ug | ORAL_TABLET | Freq: Every day | ORAL | Status: DC

## 2014-01-24 NOTE — Telephone Encounter (Signed)
Patient will not be able to make her appt; job training She would like a refill on her medication synthroid  Thank You  :)

## 2014-01-24 NOTE — Telephone Encounter (Signed)
rx sent for one month patient needs appointment for more refills

## 2014-01-28 ENCOUNTER — Other Ambulatory Visit: Payer: 59

## 2014-01-31 ENCOUNTER — Ambulatory Visit: Payer: 59 | Admitting: Endocrinology

## 2014-02-22 ENCOUNTER — Other Ambulatory Visit: Payer: Self-pay | Admitting: *Deleted

## 2014-02-22 MED ORDER — LEVOTHYROXINE SODIUM 112 MCG PO TABS: 112.0000 ug | ORAL_TABLET | Freq: Every day | ORAL | Status: DC

## 2014-02-22 NOTE — Telephone Encounter (Signed)
Patient need refill on thyriod med, levothyroxine 30 day supply. She stated she need to talk to you about this situation.    Work # [id number]   Ex 266 be there until 5:00

## 2014-02-22 NOTE — Telephone Encounter (Signed)
Appointment made for 8/5, one month refill called for Synthroid

## 2014-03-13 ENCOUNTER — Other Ambulatory Visit: Payer: 59

## 2014-03-14 ENCOUNTER — Other Ambulatory Visit: Payer: 59

## 2014-03-14 ENCOUNTER — Other Ambulatory Visit (INDEPENDENT_AMBULATORY_CARE_PROVIDER_SITE_OTHER): Payer: 59

## 2014-03-14 DIAGNOSIS — E890 Postprocedural hypothyroidism: Secondary | ICD-10-CM

## 2014-03-14 LAB — TSH: TSH: 0.51 u[IU]/mL (ref 0.35–4.50)

## 2014-03-14 LAB — T4, FREE: FREE T4: 1.24 ng/dL (ref 0.60–1.60)

## 2014-03-20 ENCOUNTER — Ambulatory Visit: Payer: 59 | Admitting: Endocrinology

## 2014-03-25 ENCOUNTER — Other Ambulatory Visit: Payer: Self-pay | Admitting: *Deleted

## 2014-03-25 ENCOUNTER — Ambulatory Visit (INDEPENDENT_AMBULATORY_CARE_PROVIDER_SITE_OTHER): Payer: 59 | Admitting: Endocrinology

## 2014-03-25 ENCOUNTER — Encounter: Payer: Self-pay | Admitting: Endocrinology

## 2014-03-25 VITALS — BP 121/85 | HR 86 | Temp 97.7°F | Resp 14 | Ht 64.5 in | Wt 139.0 lb

## 2014-03-25 DIAGNOSIS — E890 Postprocedural hypothyroidism: Secondary | ICD-10-CM

## 2014-03-25 MED ORDER — LEVOTHYROXINE SODIUM 112 MCG PO TABS: 112.0000 ug | ORAL_TABLET | Freq: Every day | ORAL | Status: DC

## 2014-03-25 NOTE — Progress Notes (Signed)
For sleep Patient ID: Olivia Page, female   DOB: March 10, 1966, 48 y.o.   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 She has not felt any different with this  She does tend to get fatigue but no cold sensitivity, dry skin or hair loss.   She thinks her fatigue may be because of her busy lifestyle and recent perimenopausal symptoms       Also does not feel nervous or jittery, no palpitations.      The patient is taking the thyroid supplement very regularly in the morning before breakfast.  Not taking any calcium or iron supplements with the thyroid supplement.     No visits with results within 1 Week(s) from this visit. Latest known visit with results is:  Appointment on 03/14/2014  Component Date Value Ref Range Status  . TSH 03/14/2014 0.51  0.35 - 4.50 uIU/mL Final  . Free T4 03/14/2014 1.24  0.60 - 1.60 ng/dL Final      Medication List       This list is accurate as of: 03/25/14 12:09 PM.  Always use your most recent med list.               ALPRAZolam 0.5 MG tablet  Commonly known as:  XANAX  Take 0.5 mg by mouth at bedtime as needed.     levothyroxine 112 MCG tablet  Commonly known as:  SYNTHROID, LEVOTHROID  Take 1 tablet (112 mcg total) by mouth daily.     lisinopril 20 MG tablet  Commonly known as:  PRINIVIL,ZESTRIL  Take 20 mg by mouth daily.        Allergies:  Allergies   Allergen Reactions  . Sulfa Antibiotics Nausea And Vomiting  . Codeine     Past Medical History  Diagnosis Date  . Anxiety   . Hypertension     Past Surgical History  Procedure Laterality Date  . Breast surgery    . Abdominal hysterectomy      Family History  Problem Relation Age of Onset  . Hypertension Mother     Social History:  reports that she has never smoked. She does not have any smokeless tobacco history on file. She reports that she does not drink alcohol or use illicit drugs.  REVIEW Of SYSTEMS:   She has a history of hypertension followed by PCP  She has a history of anxiety also   Examination:   BP 121/85  Pulse 86  Temp(Src) 97.7 F (36.5 C)  Resp 14  Ht 5' 4.5" (1.638 m)  Wt 139 lb (63.05 kg)  BMI 23.50 kg/m2  SpO2 95%  She appears somewhat anxious and depressed  NECK: Thyroid is not palpable           NEUROLOGIC EXAM:  biceps reflexes show normal relaxation Skin appears normal    Assessment/Plan:    Hypothyroidism, Postsurgical with normalization of TSH now  with 112 mcg dose She is quite compliant with her medication We'll continue the same dose and see her back in 6 months  Hypertension: She is currently following up with her gynecologist for this, blood pressure is high normal today but she is anxious  Aniah Pauli 03/25/2014, 12:09 PM

## 2014-03-25 NOTE — Patient Instructions (Signed)
No change 

## 2014-04-15 ENCOUNTER — Ambulatory Visit (INDEPENDENT_AMBULATORY_CARE_PROVIDER_SITE_OTHER): Payer: 59 | Admitting: Emergency Medicine

## 2014-04-15 VITALS — BP 128/80 | HR 89 | Temp 98.2°F | Resp 17 | Ht 65.5 in | Wt 141.0 lb

## 2014-04-15 DIAGNOSIS — J018 Other acute sinusitis: Secondary | ICD-10-CM

## 2014-04-15 MED ORDER — FLUCONAZOLE 150 MG PO TABS: 150.0000 mg | ORAL_TABLET | Freq: Once | ORAL | Status: DC

## 2014-04-15 MED ORDER — CEFPROZIL 500 MG PO TABS: 500.0000 mg | ORAL_TABLET | Freq: Two times a day (BID) | ORAL | Status: DC

## 2014-04-15 MED ORDER — PSEUDOEPHEDRINE-GUAIFENESIN ER 60-600 MG PO TB12: 1.0000 | ORAL_TABLET | Freq: Two times a day (BID) | ORAL | Status: DC

## 2014-04-15 NOTE — Progress Notes (Signed)
[facility] [address] [place] [address] [phone]  Date:  04/15/2014   Name:  Olivia Page   DOB:  05-Jul-1966   MRN:  [id number]  PCP:  No PCP Per Patient    Chief Complaint: URI, Sore Throat, Sinusitis and Headache   History of Present Illness:  Olivia Page is a 48 y.o. very pleasant female patient who presents with the following:  Nasal congestion and facial pain.  Has post nasal drainage that is purulent.  No fever or chills Sore throat Cough productive purulent sputum.  No wheezing or shortness of breath No nausea or vomiting. No improvement with over the counter medications or other home remedies.  Denies other complaint or health concern today.   Patient Active Problem List   Diagnosis Date Noted  . Postsurgical hypothyroidism 10/01/2013  . SINUSITIS- ACUTE-NOS 04/22/2009  . CELLULITIS AND ABSCESS OF FACE 02/14/2009  . HYPERTENSION 02/12/2009    Past Medical History  Diagnosis Date  . Anxiety   . Hypertension   . Blood transfusion without reported diagnosis     Past Surgical History  Procedure Laterality Date  . Breast surgery    . Abdominal hysterectomy      History  Substance Use Topics  . Smoking status: Never Smoker   . Smokeless tobacco: Not on file  . Alcohol Use: No    Family History  Problem Relation Age of Onset  . Hypertension Mother   . Cancer Maternal Grandmother   . Cancer Maternal Grandfather   . Cancer Paternal Grandmother   . Cancer Paternal Grandfather     Allergies  Allergen Reactions  . Sulfa Antibiotics Nausea And Vomiting  . Codeine     Medication list has been reviewed and updated.  Current Outpatient Prescriptions on File Prior to Visit  Medication Sig Dispense Refill  . ALPRAZolam (XANAX) 0.5 MG tablet Take 0.5 mg by mouth at bedtime as needed.      Marland Kitchen levothyroxine (SYNTHROID, LEVOTHROID) 112 MCG tablet Take 1 tablet (112 mcg total) by mouth daily.  90 tablet  1  .  lisinopril (PRINIVIL,ZESTRIL) 20 MG tablet Take 20 mg by mouth daily.       No current facility-administered medications on file prior to visit.    Review of Systems:   As per HPI, otherwise negative.    Physical Examination: Filed Vitals:   04/15/14 0949  BP: 128/80  Pulse: 89  Temp: 98.2 F (36.8 C)  Resp: 17   Filed Vitals:   04/15/14 0949  Height: 5' 5.5" (1.664 m)  Weight: 141 lb (63.957 kg)   Body mass index is 23.1 kg/(m^2). Ideal Body Weight: Weight in (lb) to have BMI = 25: 152.2  GEN: WDWN, NAD, Non-toxic, A & O x 3 HEENT: Atraumatic, Normocephalic. Neck supple. No masses, No LAD. Ears and Nose: No external deformity. CV: RRR, No M/G/R. No JVD. No thrill. No extra heart sounds. PULM: CTA B, no wheezes, crackles, rhonchi. No retractions. No resp. distress. No accessory muscle use. ABD: S, NT, ND, +BS. No rebound. No HSM. EXTR: No c/c/e NEURO Normal gait.  PSYCH: Normally interactive. Conversant. Not depressed or anxious appearing.  Calm demeanor.    Assessment and Plan: Sinusitis cefzil mucinex d  Signed,  Phillips Odor, MD

## 2014-04-15 NOTE — Patient Instructions (Signed)

## 2014-04-19 ENCOUNTER — Telehealth: Payer: Self-pay

## 2014-04-19 NOTE — Telephone Encounter (Signed)
Lm for pt to RTC since symptoms have changed since OV and she is on day 5 of abx.

## 2014-04-19 NOTE — Telephone Encounter (Signed)
Pt was seen by Dr Dareen Piano for sinus infection and says that she now has fluid in her ears and they are are clogged. She wants to know if he will call her in some ear drops or something. She can be reached @ [phone]. Thank you

## 2014-05-14 ENCOUNTER — Other Ambulatory Visit: Payer: Self-pay | Admitting: Obstetrics and Gynecology

## 2014-05-15 LAB — CYTOLOGY - PAP

## 2014-06-10 ENCOUNTER — Other Ambulatory Visit: Payer: Self-pay

## 2014-06-10 DIAGNOSIS — Z9889 Other specified postprocedural states: Secondary | ICD-10-CM

## 2014-06-10 DIAGNOSIS — Z1231 Encounter for screening mammogram for malignant neoplasm of breast: Secondary | ICD-10-CM

## 2014-07-04 ENCOUNTER — Ambulatory Visit
Admission: RE | Admit: 2014-07-04 | Discharge: 2014-07-04 | Disposition: A | Discharge status: Home / Self Care | Payer: 59 | Source: Ambulatory Visit

## 2014-07-04 ENCOUNTER — Encounter (INDEPENDENT_AMBULATORY_CARE_PROVIDER_SITE_OTHER): Payer: Self-pay

## 2014-07-04 DIAGNOSIS — Z9889 Other specified postprocedural states: Secondary | ICD-10-CM

## 2014-07-04 DIAGNOSIS — Z1231 Encounter for screening mammogram for malignant neoplasm of breast: Secondary | ICD-10-CM

## 2014-09-20 ENCOUNTER — Other Ambulatory Visit: Payer: 59

## 2014-09-20 ENCOUNTER — Other Ambulatory Visit (INDEPENDENT_AMBULATORY_CARE_PROVIDER_SITE_OTHER): Payer: Self-pay

## 2014-09-20 DIAGNOSIS — E890 Postprocedural hypothyroidism: Secondary | ICD-10-CM

## 2014-09-20 LAB — TSH: TSH: 1 u[IU]/mL (ref 0.35–4.50)

## 2014-09-24 ENCOUNTER — Other Ambulatory Visit: Payer: Self-pay | Admitting: Endocrinology

## 2014-09-25 ENCOUNTER — Ambulatory Visit (INDEPENDENT_AMBULATORY_CARE_PROVIDER_SITE_OTHER): Payer: 59 | Admitting: Endocrinology

## 2014-09-25 ENCOUNTER — Other Ambulatory Visit: Payer: Self-pay | Admitting: *Deleted

## 2014-09-25 ENCOUNTER — Encounter: Payer: Self-pay | Admitting: *Deleted

## 2014-09-25 ENCOUNTER — Encounter: Payer: Self-pay | Admitting: Endocrinology

## 2014-09-25 ENCOUNTER — Ambulatory Visit (INDEPENDENT_AMBULATORY_CARE_PROVIDER_SITE_OTHER): Payer: 59 | Admitting: Emergency Medicine

## 2014-09-25 ENCOUNTER — Telehealth: Payer: Self-pay | Admitting: Endocrinology

## 2014-09-25 VITALS — BP 120/86 | HR 94 | Temp 97.4°F | Resp 16 | Ht 63.5 in | Wt 139.0 lb

## 2014-09-25 VITALS — BP 140/92 | HR 102 | Temp 98.2°F | Resp 14 | Ht 63.5 in | Wt 140.6 lb

## 2014-09-25 DIAGNOSIS — E890 Postprocedural hypothyroidism: Secondary | ICD-10-CM

## 2014-09-25 DIAGNOSIS — J069 Acute upper respiratory infection, unspecified: Secondary | ICD-10-CM

## 2014-09-25 MED ORDER — HYDROCOD POLST-CHLORPHEN POLST 10-8 MG/5ML PO LQCR: 5.0000 mL | Freq: Two times a day (BID) | ORAL | Status: DC | PRN

## 2014-09-25 MED ORDER — PSEUDOEPHEDRINE-GUAIFENESIN ER 60-600 MG PO TB12: 1.0000 | ORAL_TABLET | Freq: Two times a day (BID) | ORAL | Status: DC

## 2014-09-25 NOTE — Patient Instructions (Signed)
Stay on same dose, no iron or calcium at same time

## 2014-09-25 NOTE — Telephone Encounter (Signed)
Lisinopril is 10 mg not 20

## 2014-09-25 NOTE — Progress Notes (Signed)
[facility] [address], [place] [id number] [phone]  Date:  09/25/2014   Name:  Olivia DewKimberly B Witczak   DOB:  [birth year]   MRN:  [id number]  PCP:  No PCP Per Patient    Chief Complaint: Cough; Sore Throat; Sinusitis; and Generalized Body Aches   History of Present Illness:  Olivia DewKimberly B Canizales is a 49 y.o. very pleasant female patient who presents with the following:  Ill four days with myalgias and arthralgias.  Cough not productive.  No wheezing or shortness of breath   Worse at night. Mucoid nasal drainage with post nasal drainage Children ill with ears and sinusitis Has a sore throat.   No nausea or vomiting No stool change No improvement with over the counter medications or other home remedies.  Denies other complaint or health concern today.   Patient Active Problem List   Diagnosis Date Noted  . Postsurgical hypothyroidism 10/01/2013  . SINUSITIS- ACUTE-NOS 04/22/2009  . CELLULITIS AND ABSCESS OF FACE 02/14/2009  . HYPERTENSION 02/12/2009    Past Medical History  Diagnosis Date  . Anxiety   . Hypertension   . Blood transfusion without reported diagnosis     Past Surgical History  Procedure Laterality Date  . Breast surgery    . Abdominal hysterectomy      History  Substance Use Topics  . Smoking status: Never Smoker   . Smokeless tobacco: Not on file  . Alcohol Use: No    Family History  Problem Relation Age of Onset  . Hypertension Mother   . Cancer Maternal Grandmother   . Cancer Maternal Grandfather   . Cancer Paternal Grandmother   . Cancer Paternal Grandfather     Allergies  Allergen Reactions  . Sulfa Antibiotics Nausea And Vomiting  . Codeine     Medication list has been reviewed and updated.  Current Outpatient Prescriptions on File Prior to Visit  Medication Sig Dispense Refill  . ALPRAZolam (XANAX) 0.5 MG tablet Take 0.5 mg by mouth at bedtime as needed.    Marland Kitchen. levothyroxine (SYNTHROID, LEVOTHROID) 112 MCG  tablet TAKE 1 TABLET (112 MCG TOTAL) BY MOUTH DAILY. 90 tablet 0  . lisinopril (PRINIVIL,ZESTRIL) 20 MG tablet Take 20 mg by mouth daily.     No current facility-administered medications on file prior to visit.    Review of Systems:  As per HPI, otherwise negative.    Physical Examination: Filed Vitals:   09/25/14 0852  BP: 120/86  Pulse: 94  Temp: 97.4 F (36.3 C)  Resp: 16   Filed Vitals:   09/25/14 0852  Height: 5' 3.5" (1.613 m)  Weight: 139 lb (63.05 kg)   Body mass index is 24.23 kg/(m^2). Ideal Body Weight: Weight in (lb) to have BMI = 25: 143.1  GEN: WDWN, NAD, Non-toxic, A & O x 3  Persistent dry cough HEENT: Atraumatic, Normocephalic. Neck supple. No masses, No LAD. Ears and Nose: No external deformity. CV: RRR, No M/G/R. No JVD. No thrill. No extra heart sounds. PULM: CTA B, no wheezes, crackles, rhonchi. No retractions. No resp. distress. No accessory muscle use. ABD: S, NT, ND, +BS. No rebound. No HSM. EXTR: No c/c/e NEURO Normal gait.  PSYCH: Normally interactive. Conversant. Not depressed or anxious appearing.  Calm demeanor.    Assessment and Plan: Viral URI Too late for flu treatment tussionex mucinex  Signed,  Phillips OdorJeffery Katiya Fike, MD

## 2014-09-25 NOTE — Telephone Encounter (Signed)
Noted, chart has been updated.

## 2014-09-25 NOTE — Progress Notes (Signed)
Patient ID: Olivia Page, female   DOB: 1966-08-13, 49 y.o.   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   The patient has been treated with Synthroid or levothyroxine initially in variable doses and has been on generic levothyroxine now because of cost The last visit was in 03/2014    On the last visit the dose continued at 112 g which she has been on since 09/2013 She has not felt any unusual fatigue since her last visit.  She does tend to occasionally tired but no cold sensitivity, dry skin or hair loss.   No recent weight change      The patient is taking the thyroid supplement very regularly in the morning before breakfast.  Not taking any calcium or iron supplements with the thyroid supplement.    Lab Results  Component Value Date   TSH 1.00 09/20/2014   TSH 0.51 03/14/2014   TSH 0.24* 09/25/2013   FREET4 1.24 03/14/2014   FREET4 1.08 09/25/2013       Medication List       This list is accurate as of: 09/25/14 10:49 AM.  Always use your most recent med list.               ALPRAZolam 0.5 MG tablet  Commonly known as:  XANAX  Take 0.5 mg by mouth at bedtime as needed.     chlorpheniramine-HYDROcodone 10-8 MG/5ML Lqcr  Commonly known as:  TUSSIONEX PENNKINETIC ER  Take 5 mLs by mouth every 12 (twelve) hours as needed.     levothyroxine 112 MCG tablet  Commonly known as:  SYNTHROID, LEVOTHROID  TAKE 1 TABLET (112 MCG TOTAL) BY MOUTH DAILY.     lisinopril 20 MG tablet  Commonly known as:  PRINIVIL,ZESTRIL  Take 20 mg by mouth daily.     lisinopril 10 MG tablet  Commonly known as:  PRINIVIL,ZESTRIL  Take 10 mg by mouth daily.     pseudoephedrine-guaifenesin 60-600 MG per  tablet  Commonly known as:  MUCINEX D  Take 1 tablet by mouth every 12 (twelve) hours.        Allergies:  Allergies  Allergen Reactions  . Sulfa Antibiotics Nausea And Vomiting  . Codeine     Past Medical History  Diagnosis Date  . Anxiety   . Hypertension   . Blood transfusion without reported diagnosis     Past Surgical History  Procedure Laterality Date  . Breast surgery    . Abdominal hysterectomy      Family History  Problem Relation Age of Onset  . Hypertension Mother   . Cancer Maternal Grandmother   . Cancer Maternal Grandfather   . Cancer Paternal Grandmother   . Cancer Paternal Grandfather     Social History:  reports that she has never smoked. She does not have any smokeless tobacco history on file. She reports that she does not drink alcohol or use illicit drugs.  REVIEW Of SYSTEMS:   She has a history of hypertension followed by her gynecologist and reportedly blood pressure is normal.  Not sure whether she is taking 10 mg or 20 mg lisinopril  No complaints of anxiety now  She is having a respiratory infection currently,  has seen the physician today for this   Examination:   BP 140/92 mmHg  Pulse 102  Temp(Src) 98.2 F (36.8 C)  Resp 14  Ht 5' 3.5" (1.613 m)  Wt 140 lb 9.6 oz (63.776 kg)  BMI 24.51 kg/m2  SpO2 99%   NECK: Thyroid is not palpable           NEUROLOGIC EXAM:  biceps reflexes show normal relaxation No peripheral edema    Assessment/Plan:    Hypothyroidism, Postsurgical with continued normal TSH  with 112 mcg dose despite taking the generic preparation She is quite compliant with her medication in the mornings Since her level has been fairly consistent she can be seen annually for follow-up  Hypertension: She is currently following up with her gynecologist for this and reportedly blood pressure has been better elsewhere  [facility] HospitalKUMAR,Ethen Bannan 09/25/2014, 10:49 AM

## 2014-12-27 ENCOUNTER — Other Ambulatory Visit: Payer: Self-pay | Admitting: Endocrinology

## 2015-05-01 ENCOUNTER — Other Ambulatory Visit: Payer: Self-pay

## 2015-05-01 DIAGNOSIS — Z1231 Encounter for screening mammogram for malignant neoplasm of breast: Secondary | ICD-10-CM

## 2015-06-24 ENCOUNTER — Other Ambulatory Visit: Payer: Self-pay | Admitting: Endocrinology

## 2015-07-07 ENCOUNTER — Ambulatory Visit
Admission: RE | Admit: 2015-07-07 | Discharge: 2015-07-07 | Disposition: A | Discharge status: Home / Self Care | Payer: 59 | Source: Ambulatory Visit

## 2015-07-07 DIAGNOSIS — Z1231 Encounter for screening mammogram for malignant neoplasm of breast: Secondary | ICD-10-CM

## 2015-08-14 ENCOUNTER — Ambulatory Visit (INDEPENDENT_AMBULATORY_CARE_PROVIDER_SITE_OTHER): Payer: 59 | Admitting: Physician Assistant

## 2015-08-14 VITALS — BP 124/82 | HR 102 | Temp 98.3°F | Resp 14 | Ht 63.5 in | Wt 145.0 lb

## 2015-08-14 DIAGNOSIS — J069 Acute upper respiratory infection, unspecified: Secondary | ICD-10-CM | POA: Diagnosis not present

## 2015-08-14 DIAGNOSIS — B9789 Other viral agents as the cause of diseases classified elsewhere: Principal | ICD-10-CM

## 2015-08-14 MED ORDER — IPRATROPIUM BROMIDE 0.03 % NA SOLN: 2.0000 | Freq: Two times a day (BID) | NASAL | Status: DC

## 2015-08-14 MED ORDER — HYDROCOD POLST-CPM POLST ER 10-8 MG/5ML PO SUER: 5.0000 mL | Freq: Two times a day (BID) | ORAL | Status: DC | PRN

## 2015-08-14 MED ORDER — GUAIFENESIN ER 1200 MG PO TB12: 1.0000 | ORAL_TABLET | Freq: Two times a day (BID) | ORAL | Status: DC | PRN

## 2015-08-14 NOTE — Patient Instructions (Signed)
Get plenty of rest and drink at least 64 ounces of water daily. 

## 2015-08-14 NOTE — Progress Notes (Signed)
Patient ID: Cleon DewKimberly B Bonifas, female    DOB: 04/10/1966, 49 y.o.   MRN: [id number]  PCP: No PCP Per Patient  Subjective:   Chief Complaint  Patient presents with  . Fatigue    symptoms x 3 days   . Nasal Congestion  . Sore Throat  . Chills    HPI Presents for evaluation of illness x 3 days. Symptoms have gradually worsened. Initially had a low-grade fever. Facial pressure and pain. Body aches. Chills. No nausea, vomiting or diarrhea. Lots of nasal congestion and runny nose-clear. Non-productive cough. Ear pain. Sore throat.  All three family members have had symptoms >1 week. Daughter diagnosed with viral illness. Her husband was here several days ago and also diagnosed with a viral illness. He had just completed an antibiotic for sinusitis and was given a strong cough medication. Her son hasn't been evaluated yet.   Review of Systems As above.    Patient Active Problem List   Diagnosis Date Noted  . Postsurgical hypothyroidism 10/01/2013  . SINUSITIS- ACUTE-NOS 04/22/2009  . CELLULITIS AND ABSCESS OF FACE 02/14/2009  . HYPERTENSION 02/12/2009     Prior to Admission medications   Medication Sig Start Date End Date Taking? Authorizing Provider  ALPRAZolam Prudy Feeler(XANAX) 0.5 MG tablet Take 0.5 mg by mouth at bedtime as needed.   Yes Historical Provider, MD  levothyroxine (SYNTHROID, LEVOTHROID) 112 MCG tablet TAKE 1 TABLET BY MOUTH DAILY. 06/24/15  Yes Reather LittlerAjay Kumar, MD  lisinopril (PRINIVIL,ZESTRIL) 10 MG tablet Take 10 mg by mouth daily. 08/13/14  Yes Historical Provider, MD     Allergies  Allergen Reactions  . Sulfa Antibiotics Nausea And Vomiting  . Codeine        Objective:  Physical Exam  Constitutional: She is oriented to person, place, and time. Vital signs are normal. She appears well-developed and well-nourished. No distress.  BP 124/82 mmHg  Pulse 102  Temp(Src) 98.3 F (36.8 C) (Oral)  Resp 14  Ht 5' 3.5" (1.613 m)  Wt 145 lb (65.772 kg)  BMI  25.28 kg/m2  SpO2 98%   HENT:  Head: Normocephalic and atraumatic.  Right Ear: Hearing, tympanic membrane, external ear and ear canal normal.  Left Ear: Hearing, tympanic membrane, external ear and ear canal normal.  Nose: Mucosal edema and rhinorrhea present.  No foreign bodies. Right sinus exhibits no maxillary sinus tenderness and no frontal sinus tenderness. Left sinus exhibits no maxillary sinus tenderness and no frontal sinus tenderness.  Mouth/Throat: Uvula is midline, oropharynx is clear and moist and mucous membranes are normal. No uvula swelling. No oropharyngeal exudate.  Eyes: Conjunctivae and EOM are normal. Pupils are equal, round, and reactive to light. Right eye exhibits no discharge. Left eye exhibits no discharge. No scleral icterus.  Neck: Trachea normal, normal range of motion and full passive range of motion without pain. Neck supple. No thyroid mass and no thyromegaly present.  Cardiovascular: Normal rate, regular rhythm and normal heart sounds.   Pulmonary/Chest: Effort normal and breath sounds normal.  Lymphadenopathy:       Head (right side): No submandibular, no tonsillar, no preauricular, no posterior auricular and no occipital adenopathy present.       Head (left side): No submandibular, no tonsillar, no preauricular and no occipital adenopathy present.    She has no cervical adenopathy.       Right: No supraclavicular adenopathy present.       Left: No supraclavicular adenopathy present.  Neurological: She is alert and oriented to  person, place, and time. She has normal strength. No cranial nerve deficit or sensory deficit.  Skin: Skin is warm, dry and intact. No rash noted.  Psychiatric: She has a normal mood and affect. Her speech is normal and behavior is normal.           Assessment & Plan:   1. Viral URI with cough Supportive care.  Anticipatory guidance.  RTC if symptoms worsen/persist. - chlorpheniramine-HYDROcodone (TUSSIONEX PENNKINETIC ER) 10-8  MG/5ML SUER; Take 5 mLs by mouth every 12 (twelve) hours as needed for cough.  Dispense: 100 mL; Refill: 0 - Guaifenesin (MUCINEX MAXIMUM STRENGTH) 1200 MG TB12; Take 1 tablet (1,200 mg total) by mouth every 12 (twelve) hours as needed.  Dispense: 14 tablet; Refill: 1 - ipratropium (ATROVENT) 0.03 % nasal spray; Place 2 sprays into both nostrils 2 (two) times daily.  Dispense: 30 mL; Refill: 0   Fernande Bras, PA-C Physician Assistant-Certified [facility]

## 2015-09-23 ENCOUNTER — Other Ambulatory Visit (INDEPENDENT_AMBULATORY_CARE_PROVIDER_SITE_OTHER): Payer: 59

## 2015-09-23 DIAGNOSIS — E890 Postprocedural hypothyroidism: Secondary | ICD-10-CM

## 2015-09-23 LAB — T4, FREE: Free T4: 1.4 ng/dL (ref 0.60–1.60)

## 2015-09-23 LAB — TSH: TSH: 0.72 u[IU]/mL (ref 0.35–4.50)

## 2015-09-26 ENCOUNTER — Encounter: Payer: Self-pay | Admitting: Endocrinology

## 2015-09-26 ENCOUNTER — Ambulatory Visit (INDEPENDENT_AMBULATORY_CARE_PROVIDER_SITE_OTHER): Payer: 59 | Admitting: Endocrinology

## 2015-09-26 VITALS — BP 118/72 | HR 80 | Temp 98.9°F | Resp 20 | Ht 63.5 in | Wt 146.0 lb

## 2015-09-26 DIAGNOSIS — E890 Postprocedural hypothyroidism: Secondary | ICD-10-CM

## 2015-09-26 NOTE — Progress Notes (Signed)
Patient ID: Olivia Page, female   DOB: June 27, 1966, 50 y.o.   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   The patient has been treated with Synthroid or levothyroxine initially in variable doses and has been on generic levothyroxine now because of cost   On the last visit the dose continued at 112 g which she has been on since 09/2013 She has not felt any unusual fatigue since her last visit.  She does tend to feel tired because of her very busy lifestyle Has no cold sensitivity except feeling cold in her hands, no dry skin or hair loss.        The patient is taking the thyroid supplement very regularly in the morning before breakfast.  Not taking any calcium or iron supplements with the thyroid supplement.  She is taking her multivitamin after lunch     Lab Results  Component Value Date   TSH 0.72 09/23/2015   TSH 1.00 09/20/2014   TSH 0.51 03/14/2014   FREET4 1.40 09/23/2015   FREET4 1.24 03/14/2014   FREET4 1.08 09/25/2013       Medication List       This list is accurate as of: 09/26/15  9:06 AM.  Always use your most recent med list.               ALPRAZolam 0.5 MG tablet  Commonly known as:  XANAX  Take 0.5 mg by mouth at bedtime as needed.     levothyroxine 112 MCG tablet  Commonly known as:  SYNTHROID, LEVOTHROID  TAKE 1 TABLET BY MOUTH DAILY.     lisinopril 10 MG tablet  Commonly known as:  PRINIVIL,ZESTRIL  Take 10 mg by mouth daily.        Allergies:  Allergies  Allergen Reactions  . Sulfa Antibiotics Nausea And Vomiting  . Codeine     Past Medical History  Diagnosis Date  . Anxiety   . Hypertension   . Blood transfusion without reported  diagnosis     Past Surgical History  Procedure Laterality Date  . Breast surgery    . Abdominal hysterectomy      Family History  Problem Relation Age of Onset  . Hypertension Mother   . Diverticulitis Mother   . Cancer Maternal Grandmother   . Cancer Maternal Grandfather   . Cancer Paternal Grandmother   . Cancer Paternal Grandfather   . Stroke Father   . Alcohol abuse Brother     in recovery    Social History:  reports that she has never smoked. She has never used smokeless tobacco. She reports that she does not drink alcohol or use illicit drugs.  REVIEW Of SYSTEMS:   She has a history of hypertension followed by her gynecologist and treated with lisinopril    Wt Readings from Last 3 Encounters:  09/26/15 146 lb (66.225 kg)  08/14/15 145 lb (65.772 kg)  09/25/14 140 lb 9.6 oz (63.776 kg)     Examination:   BP 118/72 mmHg  Pulse 80  Temp(Src) 98.9 F (37.2 C) (Oral)  Resp 20  Ht 5' 3.5" (1.613 m)  Wt 146 lb (66.225 kg)  BMI 25.45 kg/m2  SpO2 96%   NECK: Thyroid is not palpable           NEUROLOGIC EXAM:  biceps reflexes show normal relaxation Skin appears normal Hands are cool    Assessment/Plan:    Hypothyroidism, Postsurgical with continued normal TSH  with 112 mcg dose despite taking the generic preparation Advised her to get the same manufacturer if possible from her [facility]  She is quite compliant with her medication in the mornings She will continue to take her multivitamin after lunch and discussed interaction with calcium and iron  Since her level has been fairly consistent she can be seen annually for follow-up  Recommended discussion of HRT with her gynecologist when she goes into menopause  Presley Gora 09/26/2015, 9:06 AM

## 2015-09-26 NOTE — Progress Notes (Signed)
Pre visit review using our clinic review tool, if applicable. No additional management support is needed unless otherwise documented below in the visit note. 

## 2015-09-27 ENCOUNTER — Ambulatory Visit (INDEPENDENT_AMBULATORY_CARE_PROVIDER_SITE_OTHER): Payer: 59 | Admitting: Physician Assistant

## 2015-09-27 VITALS — BP 128/80 | HR 91 | Temp 97.9°F | Resp 16 | Ht 65.0 in | Wt 146.0 lb

## 2015-09-27 DIAGNOSIS — J111 Influenza due to unidentified influenza virus with other respiratory manifestations: Secondary | ICD-10-CM

## 2015-09-27 DIAGNOSIS — M791 Myalgia: Secondary | ICD-10-CM | POA: Diagnosis not present

## 2015-09-27 DIAGNOSIS — R5383 Other fatigue: Secondary | ICD-10-CM

## 2015-09-27 DIAGNOSIS — R05 Cough: Secondary | ICD-10-CM | POA: Diagnosis not present

## 2015-09-27 DIAGNOSIS — R69 Illness, unspecified: Principal | ICD-10-CM

## 2015-09-27 DIAGNOSIS — R070 Pain in throat: Secondary | ICD-10-CM | POA: Diagnosis not present

## 2015-09-27 LAB — POCT CBC
GRANULOCYTE PERCENT: 73.2 % (ref 37–80)
HEMATOCRIT: 40.9 % (ref 37.7–47.9)
Hemoglobin: 14.3 g/dL (ref 12.2–16.2)
Lymph, poc: 1.3 (ref 0.6–3.4)
MCH: 29.7 pg (ref 27–31.2)
MCHC: 34.9 g/dL (ref 31.8–35.4)
MCV: 85.3 fL (ref 80–97)
MID (CBC): 0.2 (ref 0–0.9)
MPV: 8.3 fL (ref 0–99.8)
POC GRANULOCYTE: 4.2 (ref 2–6.9)
POC LYMPH %: 23.2 % (ref 10–50)
POC MID %: 3.6 % (ref 0–12)
Platelet Count, POC: 173 10*3/uL (ref 142–424)
RBC: 4.79 M/uL (ref 4.04–5.48)
RDW, POC: 12.9 %
WBC: 5.7 10*3/uL (ref 4.6–10.2)

## 2015-09-27 LAB — POCT INFLUENZA A/B
INFLUENZA A, POC: NEGATIVE
Influenza B, POC: NEGATIVE

## 2015-09-27 MED ORDER — NAPROXEN 500 MG PO TABS: 500.0000 mg | ORAL_TABLET | Freq: Two times a day (BID) | ORAL | Status: DC

## 2015-09-27 NOTE — Progress Notes (Signed)
09/27/2015 9:24 AM   DOB: 1965/11/12 / MRN: [id number]  SUBJECTIVE:  Olivia Page is a 50 y.o. female presenting for muscle aches and chills that started two days ago.  Associates nasal congestion, HA, and subjective fever.  Denies cough. She has not had the flu shot.  She has tried tylenol and ibuprofen with modest relief.    She is allergic to sulfa antibiotics and codeine.   She  has a past medical history of Anxiety; Hypertension; and Blood transfusion without reported diagnosis.    She  reports that she has never smoked. She has never used smokeless tobacco. She reports that she does not drink alcohol or use illicit drugs. She  reports that she does not engage in sexual activity. The patient  has past surgical history that includes Breast surgery and Abdominal hysterectomy.  Her family history includes Alcohol abuse in her brother; Cancer in her maternal grandfather, maternal grandmother, paternal grandfather, and paternal grandmother; Diverticulitis in her mother; Hypertension in her mother; Stroke in her father.  Review of Systems  Constitutional: Positive for malaise/fatigue. Negative for fever, chills and diaphoresis.  HENT: Positive for congestion and sore throat.   Respiratory: Positive for cough. Negative for hemoptysis, shortness of breath and wheezing.   Cardiovascular: Negative for chest pain.  Gastrointestinal: Negative for nausea.  Musculoskeletal: Positive for myalgias.  Skin: Negative for rash.  Neurological: Negative for dizziness and weakness.  Endo/Heme/Allergies: Negative for polydipsia.    Problem list and medications reviewed and updated by myself where necessary, and exist elsewhere in the encounter.   OBJECTIVE:  BP 128/80 mmHg  Pulse 91  Temp(Src) 97.9 F (36.6 C) (Oral)  Resp 16  Ht  (1.651 m)  Wt 146 lb (66.225 kg)  BMI 24.30 kg/m2  SpO2 97%  Physical Exam  Constitutional: She is oriented to person, place, and time. She appears  well-nourished. No distress.  Eyes: EOM are normal. Pupils are equal, round, and reactive to light.  Cardiovascular: Normal rate.   Pulmonary/Chest: Effort normal and breath sounds normal. No respiratory distress. She has no wheezes. She has no rales. She exhibits no tenderness.  Abdominal: She exhibits no distension.  Neurological: She is alert and oriented to person, place, and time. No cranial nerve deficit. Gait normal.  Skin: Skin is dry. She is not diaphoretic.  Psychiatric: She has a normal mood and affect.  Vitals reviewed.   Results for orders placed or performed in visit on 09/27/15 (from the past 72 hour(s))  POCT Influenza A/B     Status: Normal   Collection Time: 09/27/15  8:44 AM  Result Value Ref Range   Influenza A, POC Negative Negative   Influenza B, POC Negative Negative  POCT CBC     Status: Normal   Collection Time: 09/27/15  8:53 AM  Result Value Ref Range   WBC 5.7 4.6 - 10.2 K/uL   Lymph, poc 1.3 0.6 - 3.4   POC LYMPH PERCENT 23.2 10 - 50 %L   MID (cbc) 0.2 0 - 0.9   POC MID % 3.6 0 - 12 %M   POC Granulocyte 4.2 2 - 6.9   Granulocyte percent 73.2 37 - 80 %G   RBC 4.79 4.04 - 5.48 M/uL   Hemoglobin 14.3 12.2 - 16.2 g/dL   HCT, POC 40.9 81.1 - 47.9 %   MCV 85.3 80 - 97 fL   MCH, POC 29.7 27 - 31.2 pg   MCHC 34.9 31.8 - 35.4 g/dL  RDW, POC 12.9 %   Platelet Count, POC 173 142 - 424 K/uL   MPV 8.3 0 - 99.8 fL    No results found.  ASSESSMENT AND PLAN  Shamiah was seen today for chills, generalized body aches and runny nose.  Diagnoses and all orders for this visit:  Influenza-like illness Comments: Work up reassuring along with exam.  Most likely common cold.  Call after 12 days of total illness or sooner in worse and will try abx therapy.  Orders: -     POCT Influenza A/B -     POCT CBC -     naproxen (NAPROSYN) 500 MG tablet; Take 1 tablet (500 mg total) by mouth 2 (two) times daily with a meal.    The patient was advised to call or  return to clinic if she does not see an improvement in symptoms or to seek the care of the closest emergency department if she worsens with the above plan.   Deliah Boston, MHS, PA-C [facility] 09/27/2015 9:24 AM

## 2015-09-27 NOTE — Patient Instructions (Signed)
Take pseudoephedrine for nasal congestion.  Start with the four hour formulation.

## 2015-10-06 ENCOUNTER — Telehealth: Payer: Self-pay

## 2015-10-06 NOTE — Telephone Encounter (Signed)
Pt was told by Deliah Boston if no better to call back and he would call her something else in. She is better but not 100%. Please call [phone] Pt was told it could take up to 48 hrs for a return call    [facility] ON [address]

## 2015-10-08 NOTE — Telephone Encounter (Signed)
PATIENT STATES SHE MISSED MICHAEL CLARK'S CALL ON TUES. SHE WOULD REALLY LIKE TO GET A CALL BACK TODAY. SHE IS STILL COUGHING UP MUCOS. SHE WOULD LIKE TO POSSIBLY GET A Z-PACK CALLED INTO HER PHARMACY. SHE WANTS TO BE SURE WE KNOW SHE IS SENSITIVE TO MANY ANTIBOTICS. BEST PHONE [phone] (CELL)  PHARMACY CHOICE IS [facility] ON [address]  MBC

## 2015-10-09 ENCOUNTER — Other Ambulatory Visit: Payer: Self-pay | Admitting: Physician Assistant

## 2015-10-09 MED ORDER — HYDROCODONE-HOMATROPINE 5-1.5 MG/5ML PO SYRP: 2.5000 mL | ORAL_SOLUTION | Freq: Every evening | ORAL | Status: DC | PRN

## 2015-10-09 MED ORDER — AZITHROMYCIN 250 MG PO TABS: ORAL_TABLET | ORAL | Status: DC

## 2015-10-09 NOTE — Telephone Encounter (Signed)
Called pt and notified that z pak was sent to pharm and cough med Rx is ready to be p/up.

## 2016-01-08 ENCOUNTER — Other Ambulatory Visit: Payer: Self-pay | Admitting: Endocrinology

## 2016-03-07 ENCOUNTER — Emergency Department (HOSPITAL_COMMUNITY): Payer: 59

## 2016-03-07 ENCOUNTER — Emergency Department (HOSPITAL_COMMUNITY)
Admission: EM | Admit: 2016-03-07 | Discharge: 2016-03-07 | Disposition: A | Discharge status: Home / Self Care | Payer: 59 | Attending: Emergency Medicine | Admitting: Emergency Medicine

## 2016-03-07 ENCOUNTER — Encounter (HOSPITAL_COMMUNITY): Payer: Self-pay | Admitting: Emergency Medicine

## 2016-03-07 DIAGNOSIS — R0789 Other chest pain: Secondary | ICD-10-CM | POA: Insufficient documentation

## 2016-03-07 DIAGNOSIS — I10 Essential (primary) hypertension: Secondary | ICD-10-CM | POA: Insufficient documentation

## 2016-03-07 LAB — URINALYSIS, ROUTINE W REFLEX MICROSCOPIC
Bilirubin Urine: NEGATIVE
GLUCOSE, UA: NEGATIVE mg/dL
HGB URINE DIPSTICK: NEGATIVE
Ketones, ur: NEGATIVE mg/dL
Leukocytes, UA: NEGATIVE
Nitrite: NEGATIVE
PROTEIN: NEGATIVE mg/dL
Specific Gravity, Urine: 1.007 (ref 1.005–1.030)
pH: 7.5 (ref 5.0–8.0)

## 2016-03-07 LAB — CBC WITH DIFFERENTIAL/PLATELET

## 2016-03-07 LAB — I-STAT TROPONIN, ED: Troponin i, poc: 0.01 ng/mL (ref 0.00–0.08)

## 2016-03-07 LAB — COMPREHENSIVE METABOLIC PANEL
ALT: 20 U/L (ref 14–54)
AST: 21 U/L (ref 15–41)
Albumin: 4.2 g/dL (ref 3.5–5.0)
Alkaline Phosphatase: 24 U/L — ABNORMAL LOW (ref 38–126)
Anion gap: 6 (ref 5–15)
BILIRUBIN TOTAL: 0.6 mg/dL (ref 0.3–1.2)
BUN: 8 mg/dL (ref 6–20)
CALCIUM: 9 mg/dL (ref 8.9–10.3)
CO2: 26 mmol/L (ref 22–32)
CREATININE: 0.69 mg/dL (ref 0.44–1.00)
Chloride: 107 mmol/L (ref 101–111)
GFR calc Af Amer: >60 mL/min (ref >60)
GFR calc non Af Amer: >60 mL/min (ref >60)
Glucose, Bld: 85 mg/dL (ref 65–99)
Potassium: 4.1 mmol/L (ref 3.5–5.1)
Sodium: 139 mmol/L (ref 135–145)
TOTAL PROTEIN: 6.7 g/dL (ref 6.5–8.1)

## 2016-03-07 LAB — POC OCCULT BLOOD, ED: FECAL OCCULT BLD: NEGATIVE

## 2016-03-07 LAB — TROPONIN I: Troponin I: <0.03 ng/mL (ref <0.03)

## 2016-03-07 LAB — D-DIMER, QUANTITATIVE (NOT AT ARMC)

## 2016-03-07 LAB — LIPASE, BLOOD: LIPASE: 24 U/L (ref 11–51)

## 2016-03-07 LAB — POC URINE PREG, ED: PREG TEST UR: NEGATIVE

## 2016-03-07 MED ORDER — SODIUM CHLORIDE 0.9 % IV BOLUS (SEPSIS)
1000.0000 mL | Freq: Once | INTRAVENOUS | Status: AC
  Administered 2016-03-07: 1000 mL via INTRAVENOUS

## 2016-03-07 MED ORDER — ONDANSETRON HCL 4 MG/2ML IJ SOLN: 4.0000 mg | Freq: Once | INTRAMUSCULAR | Status: DC

## 2016-03-07 MED ORDER — KETOROLAC TROMETHAMINE 30 MG/ML IJ SOLN
30.0000 mg | Freq: Once | INTRAMUSCULAR | Status: AC
  Administered 2016-03-07: 30 mg via INTRAVENOUS
  Filled 2016-03-07: qty 1

## 2016-03-07 MED ORDER — OMEPRAZOLE 20 MG PO CPDR: 20.0000 mg | DELAYED_RELEASE_CAPSULE | Freq: Every day | ORAL | 2 refills | Status: DC

## 2016-03-07 MED ORDER — FAMOTIDINE IN NACL 20-0.9 MG/50ML-% IV SOLN: 20.0000 mg | Freq: Two times a day (BID) | INTRAVENOUS | Status: DC

## 2016-03-07 MED ORDER — GI COCKTAIL ~~LOC~~
30.0000 mL | Freq: Once | ORAL | Status: AC
  Administered 2016-03-07: 30 mL via ORAL
  Filled 2016-03-07: qty 30

## 2016-03-07 MED ORDER — SUCRALFATE 1 G PO TABS: 1.0000 g | ORAL_TABLET | Freq: Three times a day (TID) | ORAL | 1 refills | Status: DC

## 2016-03-07 NOTE — ED Notes (Signed)
Dr Long at bedside

## 2016-03-07 NOTE — ED Provider Notes (Signed)
Emergency Department Provider Note  Time seen: Approximately 10:10 AM  I have reviewed the triage vital signs and the nursing notes.   HISTORY  Chief Complaint Chest Pain   HPI Olivia Page is a 50 y.o. female with PMH of HTN presents to the emergency department for evaluation of 4 days of intermittent left-sided chest discomfort. Patient states it feels like a fullness or "gas pain." She began taking OTC gas-ex with some interim relief but the pain returns. She traces the pain distribution primarily underneath her left breast. No radiation to her back, neck, jaw. No associated diaphoresis or difficulty breathing. She finds that changes in position sometimes relieve the pain. Initially started with eating spicy food in because of nausea she has not wanted to eat significant amounts of food over the last 4 days. Family the room note "a lot of stress" and the patient endorses this. No history of gastritis or ulcer disease. Patient does endorse a family history of CVA but not AMI.   Past Medical History:  Diagnosis Date  . Anxiety   . Blood transfusion without reported diagnosis   . Hypertension     Patient Active Problem List   Diagnosis Date Noted  . Postsurgical hypothyroidism 10/01/2013  . CELLULITIS AND ABSCESS OF FACE 02/14/2009  . HYPERTENSION 02/12/2009    Past Surgical History:  Procedure Laterality Date  . ABDOMINAL HYSTERECTOMY    . BREAST SURGERY      Current Outpatient Rx  . Order #: [id number] Class: Historical Med  . Order #: [id number] Class: Historical Med  . Order #: [id number] Class: Normal  . Order #: [id number] Class: Historical Med  . Order #: [id number] Class: Historical Med  . Order #: [id number] Class: Historical Med  . Order #: [id number] Class: Print  . Order #: [id number] Class: Print    Allergies Sulfa antibiotics and Codeine  Family History  Problem Relation Age of Onset  . Hypertension Mother   . Diverticulitis Mother   . Cancer Maternal  Grandmother   . Cancer Maternal Grandfather   . Cancer Paternal Grandmother   . Cancer Paternal Grandfather   . Stroke Father   . Alcohol abuse Brother     in recovery    Social History Social History  Substance Use Topics  . Smoking status: Never Smoker  . Smokeless tobacco: Never Used  . Alcohol use No    Review of Systems  Constitutional: No fever/chills Eyes: No visual changes. ENT: No sore throat. Cardiovascular: Positive chest pain. Respiratory: Denies shortness of breath. Gastrointestinal: No abdominal pain.  Positive nausea, no vomiting.  No diarrhea.  No constipation. Genitourinary: Negative for dysuria. Musculoskeletal: Negative for back pain. Skin: Negative for rash. Neurological: Negative for headaches, focal weakness or numbness.  10-point ROS otherwise negative.  ____________________________________________   PHYSICAL EXAM:  VITAL SIGNS: ED Triage Vitals  Enc Vitals Group     BP 03/07/16 0952 145/82     Pulse Rate 03/07/16 0952 87     Resp 03/07/16 0952 20     Temp 03/07/16 0952 98.3 F (36.8 C)     Temp src --      SpO2 03/07/16 0952 100 %     Weight 03/07/16 0952 141 lb 7 oz (64.2 kg)     Height 03/07/16 0952 5\' 4"  (1.626 m)     Pain Score 03/07/16 0951 7    Constitutional: Alert and oriented. Well appearing and in no acute distress. Eyes: Conjunctivae are normal. Head: Atraumatic. Nose: No congestion/rhinnorhea. Mouth/Throat: Mucous membranes are  moist.  Oropharynx non-erythematous. Neck: No stridor.   Cardiovascular: Normal rate, regular rhythm. Good peripheral circulation. Grossly normal heart sounds. No tenderness to palpation of the chest wall.  Respiratory: Normal respiratory effort.  No retractions. Lungs CTAB. Gastrointestinal: Soft and nontender. No distention.  Musculoskeletal: No lower extremity tenderness nor edema. No gross deformities of extremities. Neurologic:  Normal speech and language. No gross focal neurologic deficits  are appreciated.  Skin:  Skin is warm, dry and intact. No rash noted. Psychiatric: Mood and affect are normal. Speech and behavior are normal.  ____________________________________________   LABS (all labs ordered are listed, but only abnormal results are displayed)  Labs Reviewed  COMPREHENSIVE METABOLIC PANEL - Abnormal; Notable for the following:       Result Value   Alkaline Phosphatase 24 (*)    All other components within normal limits  URINALYSIS, ROUTINE W REFLEX MICROSCOPIC (NOT AT [facility]) - Abnormal; Notable for the following:    APPearance CLOUDY (*)    All other components within normal limits  CBC WITH DIFFERENTIAL/PLATELET  LIPASE, BLOOD  D-DIMER, QUANTITATIVE (NOT AT [facility])  TROPONIN I  POC URINE PREG, ED  I-STAT TROPOININ, ED  POC OCCULT BLOOD, ED   ____________________________________________  EKG  Normal. No STEMI. Reviewed in MUSE.  ____________________________________________  RADIOLOGY  Dg Chest 2 View  Result Date: 03/07/2016 CLINICAL DATA:  Sharp chest pain under left breast EXAM: CHEST  2 VIEW COMPARISON:  03/30/2010 FINDINGS: Lungs are clear.  No pleural effusion or pneumothorax. The heart is normal in size. Visualized osseous structures are within normal limits. IMPRESSION: Normal chest radiographs. Electronically Signed   By: Charline Bills M.D.   On: 03/07/2016 10:56   ____________________________________________   PROCEDURES  Procedure(s) performed:   Procedures  None ____________________________________________   INITIAL IMPRESSION / ASSESSMENT AND PLAN / ED COURSE  Pertinent labs & imaging results that were available during my care of the patient were reviewed by me and considered in my medical decision making (see chart for details).  Patient resents to the emergency department for evaluation of 4 days of intermittent left upper quadrant/lower chest discomfort. Patient is low risk for AMI with HEART score of 2 (age and 1 risk  factor). Chest pain is not reproducible on my exam. No evidence of acute intra-abdominal surgical process on my exam. Her gallbladder is completely nontender to deep pressure. No lower abdominal tenderness. Lungs are clear to auscultation. Plan for symptomatically relief of pain in the emergency department, labs, and serial biomarkers for low-risk ACS r/o. Low suspicion for PE but will send d-dimer in low risk patient with undifferentiated chest pain.   11:37 AM Hemoglobin low but all values and CBC are low. Suspect possible hemodilution. Performed rectal exam with no gross blood. Sending Hemoccult labs now.  03:30 PM Patient CP improved. Second troponin negative. D-dimer negative. Discussed results with patient and family at bedside in detail. Gave resources on f/u with PCP and discussed possible need for referral to GI if symptoms do not improve with conservative treatment.  ____________________________________________  FINAL CLINICAL IMPRESSION(S) / ED DIAGNOSES  Final diagnoses:  Atypical chest pain     MEDICATIONS GIVEN DURING THIS VISIT:  Medications  ondansetron (ZOFRAN) injection 4 mg (0 mg Intravenous Hold 03/07/16 1048)  sodium chloride 0.9 % bolus 1,000 mL (0 mLs Intravenous Stopped 03/07/16 1238)  ketorolac (TORADOL) 30 MG/ML injection 30 mg (30 mg Intravenous Given 03/07/16 1048)  gi cocktail (Maalox,Lidocaine,Donnatal) (30 mLs Oral Given 03/07/16 1048)  NEW OUTPATIENT MEDICATIONS STARTED DURING THIS VISIT:  Discharge Medication List as of 03/07/2016  3:36 PM    START taking these medications   Details  omeprazole (PRILOSEC) 20 MG capsule Take 1 capsule (20 mg total) by mouth daily., Starting Sun 03/07/2016, Print    sucralfate (CARAFATE) 1 g tablet Take 1 tablet (1 g total) by mouth 4 (four) times daily -  with meals and at bedtime., Starting Sun 03/07/2016, Print          Note:  This document was prepared using Dragon voice recognition software and may include  unintentional dictation errors.  Alona Bene, MD Emergency Medicine   Maia Plan, MD 03/07/16 [phone]

## 2016-03-07 NOTE — Discharge Instructions (Signed)
RESOURCE GUIDE ° °Chronic Pain Problems: °Contact [facility]  [phone] °Patients need to be referred by their primary care doctor. ° °Insufficient Money for Medicine: °Contact [employer]:  call [phone] ° °No Primary Care Doctor: °Call [facility]  [phone] - can help you locate a primary care doctor that  accepts your insurance, provides certain services, etc. °Physician Referral Service- [phone] ° °Agencies that provide inexpensive medical care: °[facility]  [phone] °[facility]  [phone] °[facility]  [phone] °[facility]  [phone] °[facility]  [phone] °[facility]  [phone] ° °Medicaid-accepting [place] Providers: °[facility]- [address] ° [phone], Mon-Fri 9am-7pm, Sat 9am-1pm °[facility]- [address], [address] ° [phone] °[facility]- [address] ° [phone] °[facility]- [address] [place] [address] ° [phone] °[facility]- [address], [phone] ° Only accepts [employer] Medicaid patients after they have their name  applied to their card ° °Self Pay (no insurance) in [place]: °Sickle Cell Patients - [facility] ° [address], [phone] °[facility]- [address] ° [phone] °      -     [facility]- [address] °      -     [facility]- see information above (Speak to Pam H if you do not have insurance) °      -  [facility] [place]- [address],  [phone] °      -  [facility]- [address] [place] [address], [phone] °      -  [facility]se[facility]ons[facility]-  [address], [address], [place], [phone] °      -  [facility] - [address], [phone] °      -  [facility]- [address] [place] [address], [phone], also [address] °  [address],  [phone] °      -     [facility]- [address], [phone], 1st & 3rd Saturday °        every month, 10am-1pm ° -     [facility] °  [address], [place]. °  Phone:  [phone], Fax:  [phone]. Hours of Operation:  9 am - 6 pm, M-F. ° -     [facility] °  [address], [address], [place] °  Phone: [phone], Fax: [phone]. Hours of Operation:  8:30 am - 5:30 pm, M-F. ° ° ° °Dental Assistance °If unable to pay or uninsured, contact:  [place] [facility] to become qualified for the adult dental clinic. ° °Patients with Medicaid: [facility] °[address], [phone] °[address], [phone] ° °If unable to pay, or uninsured, contact [place] [facility] ([phone] in [place], [phone] in [place]) to become qualified for the adult dental clinic ° °[facility] °[address] °[place], [place] [address] °[phone] °[email or web address] ° °Other Low-Cost Community Dental Services: °[facility]- [address], [place], [place], [address], [phone], Ext. [phone], 2nd and 4th Thursday of the month at 6:30am.  10 clients each day by appointment, can sometimes see walk-in patients if someone does not show for an appointment. °  [facility]- [address], [place], [place], [address], [phone] °[facility]- [address], [place], [place], [address], [phone] °[facility]- [phone] °[facility]- [phone] °[facility]- [phone]  °

## 2016-03-07 NOTE — ED Notes (Signed)
Patient transported to X-ray 

## 2016-03-07 NOTE — ED Notes (Signed)
Pt reports left sided rib pain that began about 4 days ago, pt reports taking "gas ex" to help with pain, reports some intermittent relief. Pt denies nausea, vomiting, or radiation of pain.

## 2016-03-07 NOTE — ED Notes (Signed)
DR. Jacqulyn Bath made aware of hemoglobin of 5.8, MD in to collect occult stool card with this RN at bedside. MD spoke with patient regarding possibilities of needing a blood transfusion, patient stated she would like to hold off on receiving blood unless she absolutely had to have it. MD advised patient there may be some other options but that if she was willing to take a transfusion, she may need to. Dr. Jacqulyn Bath advised this RN he would like a repeat CBC on patient as well.

## 2016-03-23 ENCOUNTER — Ambulatory Visit (INDEPENDENT_AMBULATORY_CARE_PROVIDER_SITE_OTHER): Payer: 59 | Admitting: Internal Medicine

## 2016-03-23 ENCOUNTER — Encounter: Payer: Self-pay | Admitting: Internal Medicine

## 2016-03-23 DIAGNOSIS — E890 Postprocedural hypothyroidism: Secondary | ICD-10-CM | POA: Diagnosis not present

## 2016-03-23 DIAGNOSIS — I10 Essential (primary) hypertension: Secondary | ICD-10-CM

## 2016-03-23 DIAGNOSIS — R0789 Other chest pain: Secondary | ICD-10-CM | POA: Insufficient documentation

## 2016-03-23 DIAGNOSIS — R071 Chest pain on breathing: Secondary | ICD-10-CM | POA: Diagnosis not present

## 2016-03-23 MED ORDER — MELOXICAM 15 MG PO TABS: 15.0000 mg | ORAL_TABLET | Freq: Every day | ORAL | 1 refills | Status: DC

## 2016-03-23 NOTE — Progress Notes (Signed)
Subjective:  Patient ID: Olivia Page, female    DOB: [birth year]  Age: 50 y.o. MRN: [id number]  CC: No chief complaint on file.   Chest Pain   This is a new problem. The current episode started in the past 7 days. The onset quality is gradual. The problem occurs constantly. The problem has been gradually improving. The pain is at a severity of 5/10. The pain is moderate. The quality of the pain is described as dull. The pain does not radiate. Pertinent negatives include no abdominal pain, back pain, cough, dizziness, headaches, nausea, numbness or weakness. The pain is aggravated by breathing and movement. She has tried acetaminophen, analgesics, NSAIDs and rest for the symptoms. The treatment provided moderate relief. There are no known risk factors.  Pertinent negatives for past medical history include no aneurysm.  Pertinent negatives for family medical history include: no CAD and no early MI.   Olivia Page presents for moderate CP L side - had to go to ER. --- New pt Prilosec helped CP is 2/10 now C/o stress  Outpatient Medications Prior to Visit  Medication Sig Dispense Refill  . ALPRAZolam (XANAX) 0.5 MG tablet Take 0.5 mg by mouth at bedtime as needed.    Marland Kitchen ibuprofen (ADVIL,MOTRIN) 200 MG tablet Take 600 mg by mouth every 6 (six) hours as needed (pain).    Marland Kitchen levothyroxine (SYNTHROID, LEVOTHROID) 112 MCG tablet TAKE 1 TABLET BY MOUTH DAILY. 90 tablet 2  . lisinopril (PRINIVIL,ZESTRIL) 10 MG tablet Take 10 mg by mouth daily.  0  . Multiple Vitamin (MULTIVITAMIN) tablet Take 1 tablet by mouth daily.    Marland Kitchen omeprazole (PRILOSEC) 20 MG capsule Take 1 capsule (20 mg total) by mouth daily. 30 capsule 2  . Simethicone (GAS-X PO) Take 1 tablet by mouth as needed (gas).    . sucralfate (CARAFATE) 1 g tablet Take 1 tablet (1 g total) by mouth 4 (four) times daily -  with meals and at bedtime. 30 tablet 1   No facility-administered medications prior to visit.     ROS Review of  Systems  Constitutional: Negative for activity change, appetite change, chills, fatigue and unexpected weight change.  HENT: Negative for congestion, mouth sores and sinus pressure.   Eyes: Negative for visual disturbance.  Respiratory: Negative for cough and chest tightness.   Cardiovascular: Positive for chest pain.  Gastrointestinal: Negative for abdominal pain and nausea.  Genitourinary: Negative for difficulty urinating, frequency and vaginal pain.  Musculoskeletal: Negative for back pain and gait problem.  Skin: Negative for pallor and rash.  Neurological: Negative for dizziness, tremors, weakness, numbness and headaches.  Psychiatric/Behavioral: Negative for confusion and sleep disturbance.    Objective:  BP 110/80   Pulse 90   Ht  (1.626 m)   Wt 142 lb (64.4 kg)   SpO2 99%   BMI 24.37 kg/m   BP Readings from Last 3 Encounters:  03/23/16 110/80  03/07/16 118/81  09/27/15 128/80    Wt Readings from Last 3 Encounters:  03/23/16 142 lb (64.4 kg)  03/07/16 141 lb 7 oz (64.2 kg)  09/27/15 146 lb (66.2 kg)    Physical Exam  Constitutional: She appears well-developed. No distress.  HENT:  Head: Normocephalic.  Right Ear: External ear normal.  Left Ear: External ear normal.  Nose: Nose normal.  Mouth/Throat: Oropharynx is clear and moist.  Eyes: Conjunctivae are normal. Pupils are equal, round, and reactive to light. Right eye exhibits no discharge. Left eye exhibits  no discharge.  Neck: Normal range of motion. Neck supple. No JVD present. No tracheal deviation present. No thyromegaly present.  Cardiovascular: Normal rate, regular rhythm and normal heart sounds.   Pulmonary/Chest: No stridor. No respiratory distress. She has no wheezes.  Abdominal: Soft. Bowel sounds are normal. She exhibits no distension and no mass. There is no tenderness. There is no rebound and no guarding.  Musculoskeletal: She exhibits no edema or tenderness.  Lymphadenopathy:    She has no  cervical adenopathy.  Neurological: She displays normal reflexes. No cranial nerve deficit. She exhibits normal muscle tone. Coordination normal.  Skin: No rash noted. No erythema.  Psychiatric: She has a normal mood and affect. Her behavior is normal. Judgment and thought content normal.    Lab Results  Component Value Date   WBC  03/07/2016    QUESTIONABLE RESULTS, RECOMMEND RECOLLECT TO VERIFY   HGB  03/07/2016    QUESTIONABLE RESULTS, RECOMMEND RECOLLECT TO VERIFY   HCT  03/07/2016    QUESTIONABLE RESULTS, RECOMMEND RECOLLECT TO VERIFY   PLT  03/07/2016    QUESTIONABLE RESULTS, RECOMMEND RECOLLECT TO VERIFY   GLUCOSE 85 03/07/2016   ALT 20 03/07/2016   AST 21 03/07/2016   NA 139 03/07/2016   K 4.1 03/07/2016   CL 107 03/07/2016   CREATININE 0.69 03/07/2016   BUN 8 03/07/2016   CO2 26 03/07/2016   TSH 0.72 09/23/2015    Dg Chest 2 View  Result Date: 03/07/2016 CLINICAL DATA:  Sharp chest pain under left breast EXAM: CHEST  2 VIEW COMPARISON:  03/30/2010 FINDINGS: Lungs are clear.  No pleural effusion or pneumothorax. The heart is normal in size. Visualized osseous structures are within normal limits. IMPRESSION: Normal chest radiographs. Electronically Signed   By: Charline BillsSriyesh  Krishnan M.D.   On: 03/07/2016 10:56   Assessment & Plan:   There are no diagnoses linked to this encounter. I am having Olivia Page maintain her ALPRAZolam, lisinopril, multivitamin, levothyroxine, ibuprofen, Simethicone (GAS-X PO), omeprazole, and sucralfate.  No orders of the defined types were placed in this encounter.    Follow-up: No Follow-up on file.  Sonda PrimesAlex Taseen Marasigan, MD

## 2016-03-23 NOTE — Progress Notes (Signed)
Pre visit review using our clinic review tool, if applicable. No additional management support is needed unless otherwise documented below in the visit note. 

## 2016-03-23 NOTE — Assessment & Plan Note (Signed)
On Lisinopril 

## 2016-03-23 NOTE — Patient Instructions (Signed)

## 2016-03-23 NOTE — Assessment & Plan Note (Signed)
Meloxicam, Prilosec x 1-2 weeks

## 2016-04-08 ENCOUNTER — Other Ambulatory Visit: Payer: Self-pay | Admitting: Obstetrics and Gynecology

## 2016-04-08 DIAGNOSIS — Z1231 Encounter for screening mammogram for malignant neoplasm of breast: Secondary | ICD-10-CM

## 2016-04-08 DIAGNOSIS — Z9889 Other specified postprocedural states: Secondary | ICD-10-CM

## 2016-07-07 ENCOUNTER — Ambulatory Visit
Admission: RE | Admit: 2016-07-07 | Discharge: 2016-07-07 | Disposition: A | Discharge status: Home / Self Care | Payer: 59 | Source: Ambulatory Visit | Attending: Obstetrics and Gynecology | Admitting: Obstetrics and Gynecology

## 2016-07-07 DIAGNOSIS — Z1231 Encounter for screening mammogram for malignant neoplasm of breast: Secondary | ICD-10-CM

## 2016-07-07 DIAGNOSIS — Z9889 Other specified postprocedural states: Secondary | ICD-10-CM

## 2016-09-06 ENCOUNTER — Ambulatory Visit (INDEPENDENT_AMBULATORY_CARE_PROVIDER_SITE_OTHER): Payer: 59 | Admitting: Nurse Practitioner

## 2016-09-06 ENCOUNTER — Encounter: Payer: Self-pay | Admitting: Nurse Practitioner

## 2016-09-06 VITALS — BP 134/88 | HR 106 | Temp 98.3°F | Ht 64.0 in | Wt 141.0 lb

## 2016-09-06 DIAGNOSIS — J0140 Acute pansinusitis, unspecified: Secondary | ICD-10-CM

## 2016-09-06 MED ORDER — DM-GUAIFENESIN ER 30-600 MG PO TB12: 1.0000 | ORAL_TABLET | Freq: Two times a day (BID) | ORAL | 0 refills | Status: DC | PRN

## 2016-09-06 MED ORDER — AZITHROMYCIN 250 MG PO TABS: 250.0000 mg | ORAL_TABLET | Freq: Every day | ORAL | 0 refills | Status: DC

## 2016-09-06 MED ORDER — SALINE SPRAY 0.65 % NA SOLN: 1.0000 | NASAL | 0 refills | Status: DC | PRN

## 2016-09-06 MED ORDER — BENZONATATE 100 MG PO CAPS: 100.0000 mg | ORAL_CAPSULE | Freq: Three times a day (TID) | ORAL | 0 refills | Status: DC | PRN

## 2016-09-06 NOTE — Patient Instructions (Signed)
URI Instructions: Encourage adequate oral hydration.  Use over-the-counter  "cold" medicines  such as "Tylenol cold" , "Advil cold",  "Mucinex" or" Mucinex D"  for cough and congestion.  Avoid decongestants if you have high blood pressure. Use" Delsym" or" Robitussin" cough syrup varietis for cough.  You can use plain "Tylenol" or "Advi"l for fever, chills and achyness.   

## 2016-09-06 NOTE — Progress Notes (Signed)
Pre visit review using our clinic review tool, if applicable. No additional management support is needed unless otherwise documented below in the visit note. 

## 2016-09-06 NOTE — Progress Notes (Signed)
Subjective:  Patient ID: Olivia Page, female    DOB: 07-07-1966  Age: 51 y.o. MRN: [id number]  CC: Cough (coughing green mucus,congestion,pressure in head for 10 days. took advile. family had pneumonia. work note?)   Sinus Problem  This is a new problem. The current episode started in the past 7 days. The problem has been waxing and waning since onset. There has been no fever. Associated symptoms include congestion, coughing, headaches, a hoarse voice, sinus pressure and a sore throat. Pertinent negatives include no chills, diaphoresis, ear pain, neck pain, shortness of breath, sneezing or swollen glands. Past treatments include acetaminophen and lying down. The treatment provided mild relief.    Outpatient Medications Prior to Visit  Medication Sig Dispense Refill  . ALPRAZolam (XANAX) 0.5 MG tablet Take 0.5 mg by mouth at bedtime as needed.    Marland Kitchen. levothyroxine (SYNTHROID, LEVOTHROID) 112 MCG tablet TAKE 1 TABLET BY MOUTH DAILY. 90 tablet 2  . lisinopril (PRINIVIL,ZESTRIL) 10 MG tablet Take 10 mg by mouth daily.  0  . Multiple Vitamin (MULTIVITAMIN) tablet Take 1 tablet by mouth daily.    . Simethicone (GAS-X PO) Take 1 tablet by mouth as needed (gas).    . meloxicam (MOBIC) 15 MG tablet Take 1 tablet (15 mg total) by mouth daily. (Patient not taking: Reported on 09/06/2016) 30 tablet 1  . omeprazole (PRILOSEC) 20 MG capsule Take 1 capsule (20 mg total) by mouth daily. (Patient not taking: Reported on 09/06/2016) 30 capsule 2   No facility-administered medications prior to visit.     ROS See HPI  Objective:  BP 134/88   Pulse (!) 106   Temp 98.3 F (36.8 C)   Ht 5\' 4"  (1.626 m)   Wt 141 lb (64 kg)   SpO2 99%   BMI 24.20 kg/m   BP Readings from Last 3 Encounters:  09/06/16 134/88  03/23/16 110/80  03/07/16 118/81    Wt Readings from Last 3 Encounters:  09/06/16 141 lb (64 kg)  03/23/16 142 lb (64.4 kg)  03/07/16 141 lb 7 oz (64.2 kg)    Physical Exam    Constitutional: She is oriented to person, place, and time.  HENT:  Right Ear: Tympanic membrane, external ear and ear canal normal.  Left Ear: Tympanic membrane, external ear and ear canal normal.  Nose: Mucosal edema and rhinorrhea present. Right sinus exhibits maxillary sinus tenderness and frontal sinus tenderness. Left sinus exhibits maxillary sinus tenderness and frontal sinus tenderness.  Mouth/Throat: Uvula is midline. No trismus in the jaw. Posterior oropharyngeal erythema present. No oropharyngeal exudate.  Eyes: No scleral icterus.  Neck: Normal range of motion. Neck supple.  Cardiovascular: Normal rate and normal heart sounds.   Pulmonary/Chest: Effort normal and breath sounds normal.  Musculoskeletal: She exhibits no edema.  Lymphadenopathy:    She has no cervical adenopathy.  Neurological: She is alert and oriented to person, place, and time.  Vitals reviewed.   Lab Results  Component Value Date   WBC  03/07/2016    QUESTIONABLE RESULTS, RECOMMEND RECOLLECT TO VERIFY   HGB  03/07/2016    QUESTIONABLE RESULTS, RECOMMEND RECOLLECT TO VERIFY   HCT  03/07/2016    QUESTIONABLE RESULTS, RECOMMEND RECOLLECT TO VERIFY   PLT  03/07/2016    QUESTIONABLE RESULTS, RECOMMEND RECOLLECT TO VERIFY   GLUCOSE 85 03/07/2016   ALT 20 03/07/2016   AST 21 03/07/2016   NA 139 03/07/2016   K 4.1 03/07/2016   CL 107 03/07/2016  CREATININE 0.69 03/07/2016   BUN 8 03/07/2016   CO2 26 03/07/2016   TSH 0.72 09/23/2015    Mm Screening Breast Tomo Bilateral  Result Date: 07/10/2016 CLINICAL DATA:  Screening. EXAM: 2D DIGITAL SCREENING BILATERAL MAMMOGRAM WITH CAD AND ADJUNCT TOMO COMPARISON:  Previous exam(s). ACR Breast Density Category c: The breast tissue is heterogeneously dense, which may obscure small masses. FINDINGS: There are no findings suspicious for malignancy. Images were processed with CAD. IMPRESSION: No mammographic evidence of malignancy. A result letter of this screening  mammogram will be mailed directly to the patient. RECOMMENDATION: Screening mammogram in one year. (Code:SM-B-01Y) BI-RADS CATEGORY  1: Negative. Electronically Signed   By: Frederico Hamman M.D.   On: 07/10/2016 10:31    Assessment & Plan:   Olivia Page was seen today for cough.  Diagnoses and all orders for this visit:  Acute non-recurrent pansinusitis -     azithromycin (ZITHROMAX Z-PAK) 250 MG tablet; Take 1 tablet (250 mg total) by mouth daily. Take 2tabs on first day, then 1tab once a day till complete -     dextromethorphan-guaiFENesin (MUCINEX DM) 30-600 MG 12hr tablet; Take 1 tablet by mouth 2 (two) times daily as needed for cough. -     sodium chloride (OCEAN) 0.65 % SOLN nasal spray; Place 1 spray into both nostrils as needed for congestion. -     benzonatate (TESSALON) 100 MG capsule; Take 1 capsule (100 mg total) by mouth 3 (three) times daily as needed for cough.   I have discontinued Olivia Page's omeprazole and meloxicam. I am also having her start on azithromycin, dextromethorphan-guaiFENesin, sodium chloride, and benzonatate. Additionally, I am having her maintain her ALPRAZolam, lisinopril, multivitamin, levothyroxine, and Simethicone (GAS-X PO).  Meds ordered this encounter  Medications  . azithromycin (ZITHROMAX Z-PAK) 250 MG tablet    Sig: Take 1 tablet (250 mg total) by mouth daily. Take 2tabs on first day, then 1tab once a day till complete    Dispense:  6 tablet    Refill:  0    Order Specific Question:   Supervising Provider    Answer:   Tresa Garter [[id number]]  . dextromethorphan-guaiFENesin (MUCINEX DM) 30-600 MG 12hr tablet    Sig: Take 1 tablet by mouth 2 (two) times daily as needed for cough.    Dispense:  14 tablet    Refill:  0    Order Specific Question:   Supervising Provider    Answer:   Tresa Garter [[id number]]  . sodium chloride (OCEAN) 0.65 % SOLN nasal spray    Sig: Place 1 spray into both nostrils as needed for congestion.    Dispense:  15  mL    Refill:  0    Order Specific Question:   Supervising Provider    Answer:   Tresa Garter [[id number]]  . benzonatate (TESSALON) 100 MG capsule    Sig: Take 1 capsule (100 mg total) by mouth 3 (three) times daily as needed for cough.    Dispense:  20 capsule    Refill:  0    Order Specific Question:   Supervising Provider    Answer:   Tresa Garter [[id number]]    Follow-up: Return if symptoms worsen or fail to improve.  Alysia Penna, NP

## 2016-09-27 ENCOUNTER — Other Ambulatory Visit: Payer: Self-pay | Admitting: Endocrinology

## 2016-09-27 ENCOUNTER — Other Ambulatory Visit (INDEPENDENT_AMBULATORY_CARE_PROVIDER_SITE_OTHER): Payer: 59

## 2016-09-27 ENCOUNTER — Other Ambulatory Visit: Payer: 59

## 2016-09-27 DIAGNOSIS — E890 Postprocedural hypothyroidism: Secondary | ICD-10-CM | POA: Diagnosis not present

## 2016-09-27 LAB — TSH: TSH: 0.47 u[IU]/mL (ref 0.35–4.50)

## 2016-09-27 LAB — T4, FREE: FREE T4: 1.2 ng/dL (ref 0.60–1.60)

## 2016-09-29 NOTE — Progress Notes (Signed)
Patient ID: Olivia Page, female   DOB: [birth year], 51 y.o.   MRN: [id number]   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   The patient has been treated with Synthroid or levothyroxine initially in variable doses and has been on generic levothyroxine because of cost  She thinks her pharmacy is giving her the same manufacturer of levothyroxine  On the last visit the dose continued at 112 g which she has been on since 09/2013 She has generally been feeling fairly good, no unusual fatigue or lethargy  Has no cold sensitivity except feeling cold in her hands, no dry skin or hair loss.        The patient is taking the thyroid supplement very regularly in the morning before breakfast.   Not taking any calcium or iron supplements with the thyroid supplement.   She is taking her multivitamin after lunch    Her weight is about the same  Her TSH appears to be trending lower  Lab Results  Component Value Date   TSH 0.47 09/27/2016   TSH 0.72 09/23/2015   TSH 1.00 09/20/2014   FREET4 1.20 09/27/2016   FREET4 1.40 09/23/2015   FREET4 1.24 03/14/2014     Allergies as of 09/30/2016      Reactions   Sulfa Antibiotics Nausea And Vomiting   Codeine    Violently ill      Medication List       Accurate as of 09/30/16 10:06 AM. Always use your most recent med list.          ALPRAZolam 0.5 MG tablet Commonly known as:  XANAX Take 0.5 mg by mouth at bedtime as needed.   azithromycin 250 MG tablet Commonly known as:  ZITHROMAX Z-PAK Take 1 tablet (250 mg total) by mouth daily. Take 2tabs on first day, then 1tab once a day till complete   benzonatate 100 MG capsule Commonly known as:  TESSALON Take 1  capsule (100 mg total) by mouth 3 (three) times daily as needed for cough.   dextromethorphan-guaiFENesin 30-600 MG 12hr tablet Commonly known as:  MUCINEX DM Take 1 tablet by mouth 2 (two) times daily as needed for cough.   GAS-X PO Take 1 tablet by mouth as needed (gas).   levothyroxine 112 MCG tablet Commonly known as:  SYNTHROID, LEVOTHROID TAKE 1 TABLET BY MOUTH DAILY.   lisinopril 10 MG tablet Commonly known as:  PRINIVIL,ZESTRIL Take 10 mg by mouth daily.   multivitamin tablet Take 1 tablet by mouth daily.   sodium chloride 0.65 % Soln nasal spray Commonly known as:  OCEAN Place 1 spray into both nostrils as needed for congestion.       Allergies:  Allergies  Allergen Reactions  . Sulfa Antibiotics Nausea And Vomiting  . Codeine     Violently ill    Past Medical History:  Diagnosis Date  . Anxiety   . Blood transfusion without reported diagnosis   . Hypertension     Past Surgical History:  Procedure Laterality Date  . ABDOMINAL HYSTERECTOMY    . BREAST SURGERY    . TOTAL THYROIDECTOMY  2011    Family History  Problem Relation Age of Onset  . Hypertension  Mother   . Diverticulitis Mother   . Cancer Maternal Grandmother   . Cancer Maternal Grandfather   . Cancer Paternal Grandmother   . Cancer Paternal Grandfather   . Stroke Father 58  . Alcohol abuse Brother     in recovery    Social History:  reports that she has never smoked. She has never used smokeless tobacco. She reports that she does not drink alcohol or use drugs.  REVIEW Of SYSTEMS:   She has a history of hypertension followed by her gynecologist and treated with lisinopril    Wt Readings from Last 3 Encounters:  09/30/16 143 lb (64.9 kg)  09/06/16 141 lb (64 kg)  03/23/16 142 lb (64.4 kg)     Examination:   BP 108/68   Pulse 74   Ht 5\' 4"  (1.626 m)   Wt 143 lb (64.9 kg)   SpO2 98%   BMI 24.55 kg/m   Thyroid is not palpable           NEUROLOGIC EXAM:  biceps reflexes  show normal relaxation Skin appears normal    Assessment/Plan:    Hypothyroidism, Postsurgical  She is doing subjectively well Although her TSH is again normal with using generic 112 g levothyroxine and it is trending lower She feels fairly good subjectively without signs of excessive thyroid hormone supplementation  For now she can continue the same prescription but take a half tablet once a week while continuing 112 g dinner 6 days per  Discussed that if she wants to take a multivitamin she can try 1 a day for women, she will take this at lunch or dinner  Follow-up in 6 months  Aftin Lye 09/30/2016, 10:06 AM

## 2016-09-30 ENCOUNTER — Encounter: Payer: Self-pay | Admitting: Endocrinology

## 2016-09-30 ENCOUNTER — Ambulatory Visit (INDEPENDENT_AMBULATORY_CARE_PROVIDER_SITE_OTHER): Payer: 59 | Admitting: Endocrinology

## 2016-09-30 VITALS — BP 108/68 | HR 74 | Ht 64.0 in | Wt 143.0 lb

## 2016-09-30 DIAGNOSIS — E890 Postprocedural hypothyroidism: Secondary | ICD-10-CM

## 2016-09-30 NOTE — Patient Instructions (Signed)
Take 6 1/2 tab pills per week

## 2016-10-09 ENCOUNTER — Other Ambulatory Visit: Payer: Self-pay | Admitting: Endocrinology

## 2017-03-28 ENCOUNTER — Telehealth: Payer: Self-pay | Admitting: Endocrinology

## 2017-03-28 NOTE — Telephone Encounter (Signed)
Patient called to cancel and reschedule 6 months f/u appointment with Dr. Lucianne MussKumar. Patient did not have labs scheduled prior to appointment and would like someone to contact her about making her lab appointment as well as rescheduling her appointment with Dr. Lucianne MussKumar. Patient stated she wanted to speak with someone from his office specifically. Please call patient and advise. OK to leave message.

## 2017-03-29 NOTE — Telephone Encounter (Signed)
Routing to you °

## 2017-03-29 NOTE — Telephone Encounter (Signed)
Please see message and schedule for labs and follow up. Thank you!

## 2017-03-30 ENCOUNTER — Ambulatory Visit: Payer: 59 | Admitting: Endocrinology

## 2017-03-30 NOTE — Telephone Encounter (Signed)
LM for pt to call back to schedule appt and labs 2-3 days prior to the visit

## 2017-04-20 ENCOUNTER — Other Ambulatory Visit: Payer: Self-pay | Admitting: Obstetrics and Gynecology

## 2017-04-20 DIAGNOSIS — Z1231 Encounter for screening mammogram for malignant neoplasm of breast: Secondary | ICD-10-CM

## 2017-05-16 ENCOUNTER — Other Ambulatory Visit: Payer: 59

## 2017-05-18 ENCOUNTER — Ambulatory Visit: Payer: 59 | Admitting: Endocrinology

## 2017-06-29 ENCOUNTER — Ambulatory Visit (INDEPENDENT_AMBULATORY_CARE_PROVIDER_SITE_OTHER): Payer: 59 | Admitting: Physician Assistant

## 2017-06-29 ENCOUNTER — Encounter: Payer: Self-pay | Admitting: Physician Assistant

## 2017-06-29 ENCOUNTER — Other Ambulatory Visit: Payer: Self-pay

## 2017-06-29 VITALS — BP 110/76 | HR 81 | Temp 98.6°F | Resp 18 | Ht 64.0 in | Wt 139.4 lb

## 2017-06-29 DIAGNOSIS — J0140 Acute pansinusitis, unspecified: Secondary | ICD-10-CM | POA: Diagnosis not present

## 2017-06-29 MED ORDER — HYDROCOD POLST-CPM POLST ER 10-8 MG/5ML PO SUER: 5.0000 mL | Freq: Two times a day (BID) | ORAL | 0 refills | Status: DC | PRN

## 2017-06-29 MED ORDER — BENZONATATE 100 MG PO CAPS: 100.0000 mg | ORAL_CAPSULE | Freq: Three times a day (TID) | ORAL | 0 refills | Status: DC | PRN

## 2017-06-29 MED ORDER — GUAIFENESIN ER 1200 MG PO TB12: 1.0000 | ORAL_TABLET | Freq: Two times a day (BID) | ORAL | 1 refills | Status: DC | PRN

## 2017-06-29 MED ORDER — AZITHROMYCIN 250 MG PO TABS: ORAL_TABLET | ORAL | 0 refills | Status: DC

## 2017-06-29 NOTE — Progress Notes (Signed)
[facility] [address], [place] [id number] [phone]  Date:  06/29/2017   Name:  Olivia DewKimberly B Kvamme   DOB:  1966/02/20   MRN:  [id number]  PCP:  Tresa GarterPlotnikov, Aleksei V, MD    History of Present Illness:  Olivia Page is a 51 y.o. female patient who presents to PCP with  Chief Complaint  Patient presents with  . Headache    x5days   . Cough    green mucus   . Nasal Congestion  . Chills     She woke up 5 days ago with a sore throat and headache.  She had nausea and vomiting, secondary to her migraine. Facial pain, congestion.  Coughing with productive green mucus.  She is congested, and sneezing.   She has aches and chills.  Subjective fever and chills.   No sob or dyspnea.  She is taking ibuprofen which helps.    Patient Active Problem List   Diagnosis Date Noted  . Costochondral chest pain 03/23/2016  . Postsurgical hypothyroidism 10/01/2013  . CELLULITIS AND ABSCESS OF FACE 02/14/2009  . Essential hypertension 02/12/2009    Past Medical History:  Diagnosis Date  . Anxiety   . Blood transfusion without reported diagnosis   . Hypertension     Past Surgical History:  Procedure Laterality Date  . ABDOMINAL HYSTERECTOMY    . BREAST SURGERY    . TOTAL THYROIDECTOMY  2011    Social History   Tobacco Use  . Smoking status: Never Smoker  . Smokeless tobacco: Never Used  Substance Use Topics  . Alcohol use: No    Alcohol/week: 0.0 oz  . Drug use: No    Family History  Problem Relation Age of Onset  . Hypertension Mother   . Diverticulitis Mother   . Cancer Maternal Grandmother   . Cancer Maternal Grandfather   . Cancer Paternal Grandmother   . Cancer Paternal Grandfather   . Stroke Father [phone]  . Alcohol abuse Brother        in recovery    Allergies  Allergen Reactions  . Sulfa Antibiotics Nausea And Vomiting  . Codeine     Violently ill    Medication list has been reviewed and updated.  Current Outpatient Medications on  File Prior to Visit  Medication Sig Dispense Refill  . ALPRAZolam (XANAX) 0.5 MG tablet Take 0.5 mg by mouth at bedtime as needed.    Marland Kitchen. levothyroxine (SYNTHROID, LEVOTHROID) 112 MCG tablet Take 1 tablet daily except half tablet on Sundays 90 tablet 2  . lisinopril (PRINIVIL,ZESTRIL) 10 MG tablet Take 10 mg by mouth daily.  0  . Multiple Vitamin (MULTIVITAMIN) tablet Take 1 tablet by mouth daily.    . benzonatate (TESSALON) 100 MG capsule Take 1 capsule (100 mg total) by mouth 3 (three) times daily as needed for cough. (Patient not taking: Reported on 09/30/2016) 20 capsule 0  . dextromethorphan-guaiFENesin (MUCINEX DM) 30-600 MG 12hr tablet Take 1 tablet by mouth 2 (two) times daily as needed for cough. (Patient not taking: Reported on 09/30/2016) 14 tablet 0  . Simethicone (GAS-X PO) Take 1 tablet by mouth as needed (gas).    . sodium chloride (OCEAN) 0.65 % SOLN nasal spray Place 1 spray into both nostrils as needed for congestion. (Patient not taking: Reported on 09/30/2016) 15 mL 0   No current facility-administered medications on file prior to visit.     ROS ROS otherwise unremarkable unless listed above.  Physical Examination:  BP 110/76   Pulse 81   Temp 98.6 F (37 C) (Oral)   Resp 18   Ht 5\' 4"  (1.626 m)   Wt 139 lb 6.4 oz (63.2 kg)   SpO2 99%   BMI 23.93 kg/m  Ideal Body Weight: Weight in (lb) to have BMI = 25: 145.3  Physical Exam  Constitutional: She is oriented to person, place, and time. She appears well-developed and well-nourished. No distress.  HENT:  Head: Normocephalic and atraumatic.  Right Ear: Tympanic membrane, external ear and ear canal normal.  Left Ear: Tympanic membrane, external ear and ear canal normal.  Nose: Mucosal edema and rhinorrhea present. Right sinus exhibits maxillary sinus tenderness. Right sinus exhibits no frontal sinus tenderness. Left sinus exhibits maxillary sinus tenderness. Left sinus exhibits no frontal sinus tenderness.  Mouth/Throat:  No uvula swelling. No oropharyngeal exudate, posterior oropharyngeal edema or posterior oropharyngeal erythema.  Eyes: Conjunctivae and EOM are normal. Pupils are equal, round, and reactive to light.  Cardiovascular: Normal rate and regular rhythm. Exam reveals no gallop, no distant heart sounds and no friction rub.  No murmur heard. Pulmonary/Chest: Effort normal. No respiratory distress. She has no decreased breath sounds. She has no wheezes. She has no rhonchi.  Lymphadenopathy:       Head (right side): No submandibular, no tonsillar, no preauricular and no posterior auricular adenopathy present.       Head (left side): No submandibular, no tonsillar, no preauricular and no posterior auricular adenopathy present.  Neurological: She is alert and oriented to person, place, and time.  Skin: She is not diaphoretic.  Psychiatric: She has a normal mood and affect. Her behavior is normal.     Assessment and Plan: Olivia Page is a 51 y.o. female who is here today for cc of  Chief Complaint  Patient presents with  . Headache    x5days   . Cough    green mucus   . Nasal Congestion  . Chills  patient reports that she is very sensitive to medications and feels like azithromycin offers the best treatment.  Advised that this is not the toc, but will do as this is not an illogical treatment choice.  Rtc as needed.  Acute non-recurrent pansinusitis - Plan: Guaifenesin (MUCINEX MAXIMUM STRENGTH) 1200 MG TB12, azithromycin (ZITHROMAX) 250 MG tablet, benzonatate (TESSALON) 100 MG capsule, chlorpheniramine-HYDROcodone (TUSSIONEX PENNKINETIC ER) 10-8 MG/5ML SUER  Trena PlattStephanie Shon Mansouri, PA-C [facility] 11/20/201810:18 AM

## 2017-06-29 NOTE — Patient Instructions (Addendum)
Please do the Flonase as we discussed Make sure you are hydrating well.    Sinusitis, Adult Sinusitis is soreness and inflammation of your sinuses. Sinuses are hollow spaces in the bones around your face. Your sinuses are located:  Around your eyes.  In the middle of your forehead.  Behind your nose.  In your cheekbones.  Your sinuses and nasal passages are lined with a stringy fluid (mucus). Mucus normally drains out of your sinuses. When your nasal tissues become inflamed or swollen, the mucus can become trapped or blocked so air cannot flow through your sinuses. This allows bacteria, viruses, and funguses to grow, which leads to infection. Sinusitis can develop quickly and last for 7?10 days (acute) or for more than 12 weeks (chronic). Sinusitis often develops after a cold. What are the causes? This condition is caused by anything that creates swelling in the sinuses or stops mucus from draining, including:  Allergies.  Asthma.  Bacterial or viral infection.  Abnormally shaped bones between the nasal passages.  Nasal growths that contain mucus (nasal polyps).  Narrow sinus openings.  Pollutants, such as chemicals or irritants in the air.  A foreign object stuck in the nose.  A fungal infection. This is rare.  What increases the risk? The following factors may make you more likely to develop this condition:  Having allergies or asthma.  Having had a recent cold or respiratory tract infection.  Having structural deformities or blockages in your nose or sinuses.  Having a weak immune system.  Doing a lot of swimming or diving.  Overusing nasal sprays.  Smoking.  What are the signs or symptoms? The main symptoms of this condition are pain and a feeling of pressure around the affected sinuses. Other symptoms include:  Upper toothache.  Earache.  Headache.  Bad breath.  Decreased sense of smell and taste.  A cough that may get worse at  night.  Fatigue.  Fever.  Thick drainage from your nose. The drainage is often green and it may contain pus (purulent).  Stuffy nose or congestion.  Postnasal drip. This is when extra mucus collects in the throat or back of the nose.  Swelling and warmth over the affected sinuses.  Sore throat.  Sensitivity to light.  How is this diagnosed? This condition is diagnosed based on symptoms, a medical history, and a physical exam. To find out if your condition is acute or chronic, your health care provider may:  Look in your nose for signs of nasal polyps.  Tap over the affected sinus to check for signs of infection.  View the inside of your sinuses using an imaging device that has a light attached (endoscope).  If your health care provider suspects that you have chronic sinusitis, you may also:  Be tested for allergies.  Have a sample of mucus taken from your nose (nasal culture) and checked for bacteria.  Have a mucus sample examined to see if your sinusitis is related to an allergy.  If your sinusitis does not respond to treatment and it lasts longer than 8 weeks, you may have an MRI or CT scan to check your sinuses. These scans also help to determine how severe your infection is. In rare cases, a bone biopsy may be done to rule out more serious types of fungal sinus disease. How is this treated? Treatment for sinusitis depends on the cause and whether your condition is chronic or acute. If a virus is causing your sinusitis, your symptoms will go  away on their own within 10 days. You may be given medicines to relieve your symptoms, including:  Topical nasal decongestants. They shrink swollen nasal passages and let mucus drain from your sinuses.  Antihistamines. These drugs block inflammation that is triggered by allergies. This can help to ease swelling in your nose and sinuses.  Topical nasal corticosteroids. These are nasal sprays that ease inflammation and swelling in  your nose and sinuses.  Nasal saline washes. These rinses can help to get rid of thick mucus in your nose.  If your condition is caused by bacteria, you will be given an antibiotic medicine. If your condition is caused by a fungus, you will be given an antifungal medicine. Surgery may be needed to correct underlying conditions, such as narrow nasal passages. Surgery may also be needed to remove polyps. Follow these instructions at home: Medicines  Take, use, or apply over-the-counter and prescription medicines only as told by your health care provider. These may include nasal sprays.  If you were prescribed an antibiotic medicine, take it as told by your health care provider. Do not stop taking the antibiotic even if you start to feel better. Hydrate and Humidify  Drink enough water to keep your urine clear or pale yellow. Staying hydrated will help to thin your mucus.  Use a cool mist humidifier to keep the humidity level in your home above 50%.  Inhale steam for 10-15 minutes, 3-4 times a day or as told by your health care provider. You can do this in the bathroom while a hot shower is running.  Limit your exposure to cool or dry air. Rest  Rest as much as possible.  Sleep with your head raised (elevated).  Make sure to get enough sleep each night. General instructions  Apply a warm, moist washcloth to your face 3-4 times a day or as told by your health care provider. This will help with discomfort.  Wash your hands often with soap and water to reduce your exposure to viruses and other germs. If soap and water are not available, use hand sanitizer.  Do not smoke. Avoid being around people who are smoking (secondhand smoke).  Keep all follow-up visits as told by your health care provider. This is important. Contact a health care provider if:  You have a fever.  Your symptoms get worse.  Your symptoms do not improve within 10 days. Get help right away if:  You have a  severe headache.  You have persistent vomiting.  You have pain or swelling around your face or eyes.  You have vision problems.  You develop confusion.  Your neck is stiff.  You have trouble breathing. This information is not intended to replace advice given to you by your health care provider. Make sure you discuss any questions you have with your health care provider. Document Released: 08/02/2005 Document Revised: 03/28/2016 Document Reviewed: 05/28/2015 Elsevier Interactive Patient Education  2017 [employer]     IF you received an x-ray today, you will receive an invoice from [facility]. Please contact [facility] at [phone] with questions or concerns regarding your invoice.   IF you received labwork today, you will receive an invoice from [facility]. Please contact [facility] at [phone] with questions or concerns regarding your invoice.   Our billing staff will not be able to assist you with questions regarding bills from these companies.  You will be contacted with the lab results as soon as they are available. The fastest way to get  your results is to activate your My Chart account. Instructions are located on the last page of this paperwork. If you have not heard from [place] regarding the results in 2 weeks, please contact this office.

## 2017-07-05 ENCOUNTER — Encounter: Payer: Self-pay | Admitting: Physician Assistant

## 2017-07-11 ENCOUNTER — Ambulatory Visit
Admission: RE | Admit: 2017-07-11 | Discharge: 2017-07-11 | Disposition: A | Discharge status: Home / Self Care | Payer: 59 | Source: Ambulatory Visit | Attending: Obstetrics and Gynecology | Admitting: Obstetrics and Gynecology

## 2017-07-11 DIAGNOSIS — Z1231 Encounter for screening mammogram for malignant neoplasm of breast: Secondary | ICD-10-CM

## 2017-07-13 ENCOUNTER — Other Ambulatory Visit (INDEPENDENT_AMBULATORY_CARE_PROVIDER_SITE_OTHER): Payer: 59

## 2017-07-13 DIAGNOSIS — E890 Postprocedural hypothyroidism: Secondary | ICD-10-CM | POA: Diagnosis not present

## 2017-07-13 LAB — TSH: TSH: 0.4 u[IU]/mL (ref 0.35–4.50)

## 2017-07-13 LAB — T4, FREE: FREE T4: 1.13 ng/dL (ref 0.60–1.60)

## 2017-07-15 ENCOUNTER — Ambulatory Visit (INDEPENDENT_AMBULATORY_CARE_PROVIDER_SITE_OTHER): Payer: 59 | Admitting: Endocrinology

## 2017-07-15 ENCOUNTER — Encounter: Payer: Self-pay | Admitting: Endocrinology

## 2017-07-15 VITALS — BP 110/74 | HR 81 | Ht 64.0 in | Wt 139.0 lb

## 2017-07-15 DIAGNOSIS — E890 Postprocedural hypothyroidism: Secondary | ICD-10-CM | POA: Diagnosis not present

## 2017-07-15 DIAGNOSIS — I10 Essential (primary) hypertension: Secondary | ICD-10-CM | POA: Diagnosis not present

## 2017-07-15 NOTE — Progress Notes (Signed)
Patient ID: Olivia Page, female   DOB: 17-Jan-1966, 51 y.o.   MRN: [id number]   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   The patient has been treated with Synthroid or levothyroxine initially in variable doses and has been on generic levothyroxine because of cost  She thinks her pharmacy is giving her the same manufacturer of levothyroxine  On the last visit the prescription continued at 112 g which she has been on since 09/2013; however she was told to take 6-1/2 tablets a week because of low normal TSH  She has generally been feeling fairly good, no unusual fatigue recently  Has no cold intolerance except feeling cold in her hands, no dry skin or hair loss.  She has lost about 4 pounds since her last visit       The patient is taking the thyroid supplement regularly in the morning before breakfast.   Not taking any calcium or iron supplements with the thyroid supplement, does take vitamin D3 .    Her TSH appears to be persistently low normal now  Wt Readings from Last 3 Encounters:  07/15/17 139 lb (63 kg)  06/29/17 139 lb 6.4 oz (63.2 kg)  09/30/16 143 lb (64.9 kg)    Lab Results  Component Value Date   TSH 0.40 07/13/2017   TSH 0.47 09/27/2016   TSH 0.72 09/23/2015   FREET4 1.13 07/13/2017   FREET4 1.20 09/27/2016   FREET4 1.40 09/23/2015     Allergies as of 07/15/2017      Reactions   Sulfa Antibiotics Nausea And Vomiting   Codeine    Violently ill      Medication List        Accurate as of 07/15/17 11:59 PM. Always use your most recent med list.          ALPRAZolam 0.5 MG tablet Commonly known as:  XANAX Take 0.5 mg by mouth at bedtime as needed.   cholecalciferol  1000 units tablet Commonly known as:  VITAMIN D Take 1,000 Units by mouth daily.   GAS-X PO Take 1 tablet by mouth as needed (gas).   levothyroxine 112 MCG tablet Commonly known as:  SYNTHROID, LEVOTHROID Take 1 tablet daily except half tablet on Sundays   lisinopril 10 MG tablet Commonly known as:  PRINIVIL,ZESTRIL Take 10 mg by mouth daily.   multivitamin tablet Take 1 tablet by mouth daily.       Allergies:  Allergies  Allergen Reactions  . Sulfa Antibiotics Nausea And Vomiting  . Codeine     Violently ill    Past Medical History:  Diagnosis Date  . Anxiety   . Blood transfusion without reported diagnosis   . Hypertension     Past Surgical History:  Procedure Laterality Date  . ABDOMINAL HYSTERECTOMY    . BREAST SURGERY    . TOTAL THYROIDECTOMY  2011    Family History  Problem Relation Age of Onset  . Hypertension Mother   . Diverticulitis Mother   . Cancer Maternal Grandmother   . Cancer Maternal Grandfather   . Cancer Paternal Grandmother   . Cancer Paternal Grandfather   . Stroke Father [id number]  . Alcohol abuse Brother  in recovery    Social History:  reports that  has never smoked. she has never used smokeless tobacco. She reports that she does not drink alcohol or use drugs.  REVIEW Of SYSTEMS:   She has a history of hypertension followed by her gynecologist and treated with lisinopril 10 mg, no recent change   BP Readings from Last 3 Encounters:  07/15/17 110/74  06/29/17 110/76  09/30/16 108/68        Examination:   BP 110/74   Pulse 81   Ht 5\' 4"  (1.626 m)   Wt 139 lb (63 kg)   SpO2 98%   BMI 23.86 kg/m   She looks well          biceps reflexes show normal relaxation Skin appears normal    Assessment/Plan:    Hypothyroidism, postsurgical   She is doing subjectively well Again her TSH is low normal although clinically she has no change in symptoms She is on 6-1/2 tablets a week with her 112 g levothyroxine She has  been consistently compliant with this  She will cut back the dose to 100 g daily now  She is asking about preventive health care related to being perimenopausal and discussed that she should take a calcium tablet in addition to her vitamin D, she should take this at dinnertime instead of in the morning   continue follow-up with gynecologist for general care and blood pressure  Follow-up in 6 months  There are no Patient Instructions on file for this visit.   Joud Ingwersen 07/16/2017, 6:02 PM

## 2017-07-16 MED ORDER — LEVOTHYROXINE SODIUM 100 MCG PO TABS: 100.0000 ug | ORAL_TABLET | Freq: Every day | ORAL | 3 refills | Status: DC

## 2017-08-02 ENCOUNTER — Other Ambulatory Visit: Payer: Self-pay | Admitting: Endocrinology

## 2017-09-08 DIAGNOSIS — Z809 Family history of malignant neoplasm, unspecified: Secondary | ICD-10-CM | POA: Diagnosis not present

## 2017-09-28 ENCOUNTER — Telehealth: Payer: Self-pay | Admitting: *Deleted

## 2017-09-28 ENCOUNTER — Ambulatory Visit (INDEPENDENT_AMBULATORY_CARE_PROVIDER_SITE_OTHER): Payer: BLUE CROSS/BLUE SHIELD | Admitting: Physician Assistant

## 2017-09-28 ENCOUNTER — Other Ambulatory Visit: Payer: Self-pay

## 2017-09-28 ENCOUNTER — Encounter: Payer: Self-pay | Admitting: Physician Assistant

## 2017-09-28 ENCOUNTER — Ambulatory Visit: Payer: 59 | Admitting: Internal Medicine

## 2017-09-28 VITALS — BP 110/72 | HR 93 | Temp 98.2°F | Resp 16 | Ht 64.5 in | Wt 141.2 lb

## 2017-09-28 DIAGNOSIS — R112 Nausea with vomiting, unspecified: Secondary | ICD-10-CM | POA: Diagnosis not present

## 2017-09-28 DIAGNOSIS — R6889 Other general symptoms and signs: Secondary | ICD-10-CM

## 2017-09-28 DIAGNOSIS — Z20828 Contact with and (suspected) exposure to other viral communicable diseases: Secondary | ICD-10-CM

## 2017-09-28 LAB — POCT URINALYSIS DIP (MANUAL ENTRY)
Bilirubin, UA: NEGATIVE
Blood, UA: NEGATIVE
Glucose, UA: NEGATIVE mg/dL
Leukocytes, UA: NEGATIVE
Nitrite, UA: NEGATIVE
Protein Ur, POC: 30 mg/dL — AB
Spec Grav, UA: 1.025 (ref 1.010–1.025)
Urobilinogen, UA: 0.2 E.U./dL
pH, UA: 6 (ref 5.0–8.0)

## 2017-09-28 LAB — POC INFLUENZA A&B (BINAX/QUICKVUE)
Influenza A, POC: NEGATIVE
Influenza B, POC: NEGATIVE

## 2017-09-28 MED ORDER — BALOXAVIR MARBOXIL(40 MG DOSE) 2 X 20 MG PO TBPK: 40.0000 mg | ORAL_TABLET | Freq: Once | ORAL | 0 refills | Status: AC

## 2017-09-28 MED ORDER — SODIUM CHLORIDE 0.9 % IV BOLUS (SEPSIS)
1000.0000 mL | Freq: Once | INTRAVENOUS | Status: AC
  Administered 2017-09-28: 1000 mL via INTRAVENOUS

## 2017-09-28 MED ORDER — ONDANSETRON 8 MG PO TBDP: 8.0000 mg | ORAL_TABLET | Freq: Three times a day (TID) | ORAL | 0 refills | Status: DC | PRN

## 2017-09-28 MED ORDER — PROMETHAZINE HCL 25 MG/ML IJ SOLN
25.0000 mg | Freq: Once | INTRAMUSCULAR | Status: AC
  Administered 2017-09-28: 25 mg via INTRAMUSCULAR

## 2017-09-28 MED ORDER — PROMETHAZINE HCL 12.5 MG PO TABS: 12.5000 mg | ORAL_TABLET | Freq: Three times a day (TID) | ORAL | 0 refills | Status: DC | PRN

## 2017-09-28 MED ORDER — OSELTAMIVIR PHOSPHATE 75 MG PO CAPS: 75.0000 mg | ORAL_CAPSULE | Freq: Two times a day (BID) | ORAL | 0 refills | Status: DC

## 2017-09-28 NOTE — Progress Notes (Signed)
Olivia Page  MRN: [id number] DOB: 12-18-1965  PCP: Tresa Garter, MD  Subjective:  Pt is a 52 year old female PMH HTN, hypothyroidism who presents to clinic for body aches, fever, nasal congestion, vomiting and HA. Symptoms started two days ago. Fever of 101 yesterday. She c/o feeling very dehydrated, nauseous and fatigued.  Her daughter diagnosed with flu x 2 days ago She is eating and drinking fine, however not eating today due to nausea.   Review of Systems  Constitutional: Positive for fatigue and fever. Negative for chills and diaphoresis.  HENT: Positive for congestion and rhinorrhea. Negative for postnasal drip, sinus pressure, sinus pain and sore throat.   Respiratory: Positive for cough. Negative for shortness of breath and wheezing.   Gastrointestinal: Positive for nausea and vomiting.  Neurological: Positive for headaches.  Psychiatric/Behavioral: Negative for sleep disturbance.    Patient Active Problem List   Diagnosis Date Noted  . Costochondral chest pain 03/23/2016  . Postsurgical hypothyroidism 10/01/2013  . CELLULITIS AND ABSCESS OF FACE 02/14/2009  . Essential hypertension 02/12/2009    Current Outpatient Medications on File Prior to Visit  Medication Sig Dispense Refill  . ALPRAZolam (XANAX) 0.5 MG tablet Take 0.5 mg by mouth at bedtime as needed.    . cholecalciferol (VITAMIN D) 1000 units tablet Take 1,000 Units by mouth daily.    Marland Kitchen levothyroxine (SYNTHROID, LEVOTHROID) 112 MCG tablet TAKE ONE TABLET BY MOUTH DAILY EXCEPT ON SUNDAYS. ON SUNDAYS TAKE ONE-HALF TABLET. 90 tablet 2  . lisinopril (PRINIVIL,ZESTRIL) 10 MG tablet Take 10 mg by mouth daily.  0  . Multiple Vitamin (MULTIVITAMIN) tablet Take 1 tablet by mouth daily.    Marland Kitchen levothyroxine (SYNTHROID, LEVOTHROID) 100 MCG tablet Take 1 tablet (100 mcg total) by mouth daily. (Patient not taking: Reported on 09/28/2017) 90 tablet 3   No current facility-administered medications on file prior to  visit.     Allergies  Allergen Reactions  . Sulfa Antibiotics Nausea And Vomiting  . Codeine     Violently ill     Objective:  BP 110/72   Pulse 93   Temp 98.2 F (36.8 C) (Oral)   Resp 16   Ht 5' 4.5" (1.638 m)   Wt 141 lb 3.2 oz (64 kg)   SpO2 98%   BMI 23.86 kg/m   Physical Exam  Constitutional: She is oriented to person, place, and time and well-developed, well-nourished, and in no distress. No distress.  Pt lying supine on exam table.   HENT:  Right Ear: Tympanic membrane normal.  Left Ear: Tympanic membrane normal.  Nose: Mucosal edema present. No rhinorrhea. Right sinus exhibits no maxillary sinus tenderness and no frontal sinus tenderness. Left sinus exhibits no maxillary sinus tenderness and no frontal sinus tenderness.  Mouth/Throat: Oropharynx is clear and moist and mucous membranes are normal.  Cardiovascular: Normal rate, regular rhythm and normal heart sounds.  Pulmonary/Chest: Effort normal and breath sounds normal. No respiratory distress. She has no wheezes. She has no rales.  Neurological: She is alert and oriented to person, place, and time. GCS score is 15.  Skin: Skin is warm and dry.  Psychiatric: Mood, memory, affect and judgment normal.  Vitals reviewed.  Results for orders placed or performed in visit on 09/28/17  POC Influenza A&B (Binax test)  Result Value Ref Range   Influenza A, POC Negative Negative   Influenza B, POC Negative Negative  POCT urinalysis dipstick  Result Value Ref Range   Color, UA yellow  yellow   Clarity, UA cloudy (A) clear   Glucose, UA negative negative mg/dL   Bilirubin, UA negative negative   Ketones, POC UA >= (160) (A) negative mg/dL   Spec Grav, UA 1.4781.025 2.9561.010 - 1.025   Blood, UA negative negative   pH, UA 6.0 5.0 - 8.0   Protein Ur, POC =30 (A) negative mg/dL   Urobilinogen, UA 0.2 0.2 or 1.0 E.U./dL   Nitrite, UA Negative Negative   Leukocytes, UA Negative Negative     Assessment and Plan :  1.  Flu-like symptoms 2. Exposure to the flu - POC Influenza A&B (Binax test) - Baloxavir Marboxil 40 MG Dose (XOFLUZA) 20 (2) MG TBPK; Take 40 mg by mouth once for 1 dose.  Dispense: 2 each; Refill: 0 - oseltamivir (TAMIFLU) 75 MG capsule; Take 1 capsule (75 mg total) by mouth 2 (two) times daily.  Dispense: 10 capsule; Refill: 0 - POCT urinalysis dipstick - PR NORMAL SALINE SOLUTION INFUS - PR CATH IMPL VASC ACCESS PORTAL - sodium chloride 0.9 % bolus 1,000 mL - Pt presents with 2 days flu like symptoms and +flu exposure. She has a negative flu test today, however suspect false negative. She is feeling better following phenergan and IVF. Encouraged hydration. Will treat for flu. RTC if no improvement in 3-5 days.    3. Nausea and vomiting, intractability of vomiting not specified, unspecified vomiting type - ondansetron (ZOFRAN-ODT) 8 MG disintegrating tablet; Take 1 tablet (8 mg total) by mouth every 8 (eight) hours as needed for nausea or vomiting.  Dispense: 12 tablet; Refill: 0 - promethazine (PHENERGAN) 12.5 MG tablet; Take 1 tablet (12.5 mg total) by mouth every 8 (eight) hours as needed for nausea or vomiting.  Dispense: 20 tablet; Refill: 0 - promethazine (PHENERGAN) injection 25 mg    Marco CollieWhitney Anara Cowman, PA-C  [facility] 09/28/2017 9:49 AM

## 2017-09-28 NOTE — Telephone Encounter (Signed)
Per [place], PA call [facility] and cancel Rx for Ondansetron 8 mg. Rx cancelled.

## 2017-09-28 NOTE — Patient Instructions (Addendum)
You tested negative for the flu, however I suspect this is a false negative. I am treating you for the flu as you have symptoms with positive flu exposure.  I have sent a note to your pharmacist asking to fill most cost effective medication.  Take phenergan for nausea.  Tylenol for fever and pain.  Come back if you are not better in 5-7 days.   You are contagious - take precautionary measures.  Stay well hydrated!!!! Get lost of rest. Wash your hands often.   -Foods that can help speed recovery: honey, garlic, chicken soup, elderberries, green tea.  -Supplements that can help speed recovery: vitamin C, zinc, elderberry extract, quercetin, ginseng, selenium -Supplement with prebiotics and probiotics.   Advil or ibuprofen for pain. Do not take Aspirin.  Drink enough water and fluids to keep your urine clear or pale yellow.  For sore throat: ? Gargle with 8 oz of salt water ( tsp of salt per 1 qt of water) as often as every 1-2 hours to soothe your throat.  Gargle liquid benadryl.  Cepacol throat lozenges (if you are not at risk for choking).  For sore throat try using a honey-based tea. Use 3 teaspoons of honey with juice squeezed from half lemon. Place shaved pieces of ginger into 1/2-1 cup of water and warm over stove top. Then mix the ingredients and repeat every 4 hours as needed.  Cough Syrup Recipe: Sweet Lemon & Honey Thyme  Ingredients a handful of fresh thyme sprigs   1 pint of water (2 cups)  1/2 cup honey (raw is best, but regular will do)  1/2 lemon chopped Instructions 1. Place the lemon in the pint jar and cover with the honey. The honey will macerate the lemons and draw out liquids which taste so delicious! 2. Meanwhile, toss the thyme leaves into a saucepan and cover them with the water. 3. Bring the water to a gentle simmer and reduce it to half, about a cup of tea. 4. When the tea is reduced and cooled a bit, strain the sprigs & leaves, add it into the pint jar and  stir it well. 5. Give it a shake and use a spoonful as needed. 6. Store your homemade cough syrup in the refrigerator for about a month.   Is there anything I can do on my own to get rid of my cough? Yes. To help get rid of your cough, you can: ?Use a humidifier in your bedroom ?Use an over-the-counter cough medicine, or suck on cough drops or hard candy ?Stop smoking, if you smoke ?If you have allergies, avoid the things you are allergic to (like pollen, dust, animals, or mold) If you have acid reflux, your doctor or nurse will tell you which lifestyle changes can help reduce symptoms.    Thank you for coming in today. I hope you feel we met your needs.  Feel free to call PCP if you have any questions or further requests.  Please consider signing up for MyChart if you do not already have it, as this is a great way to communicate with me.  Best,  Whitney McVey, PA-C   IF you received an x-ray today, you will receive an invoice from [facility]. Please contact [facility] at [phone] with questions or concerns regarding your invoice.   IF you received labwork today, you will receive an invoice from [facility]. Please contact [facility] at [phone] with questions or concerns regarding your invoice.   Our billing staff  will not be able to assist you with questions regarding bills from these companies.  You will be contacted with the lab results as soon as they are available. The fastest way to get your results is to activate your My Chart account. Instructions are located on the last page of this paperwork. If you have not heard from [place] regarding the results in 2 weeks, please contact this office.

## 2017-11-16 ENCOUNTER — Encounter: Payer: Self-pay | Admitting: Physician Assistant

## 2018-01-06 ENCOUNTER — Other Ambulatory Visit: Payer: 59

## 2018-01-10 ENCOUNTER — Ambulatory Visit: Payer: 59 | Admitting: Endocrinology

## 2018-02-27 DIAGNOSIS — H5203 Hypermetropia, bilateral: Secondary | ICD-10-CM | POA: Diagnosis not present

## 2018-03-27 DIAGNOSIS — M7751 Other enthesopathy of right foot: Secondary | ICD-10-CM | POA: Diagnosis not present

## 2018-03-27 DIAGNOSIS — M19071 Primary osteoarthritis, right ankle and foot: Secondary | ICD-10-CM | POA: Diagnosis not present

## 2018-03-27 DIAGNOSIS — M84363A Stress fracture, right fibula, initial encounter for fracture: Secondary | ICD-10-CM | POA: Diagnosis not present

## 2018-03-27 DIAGNOSIS — M659 Synovitis and tenosynovitis, unspecified: Secondary | ICD-10-CM | POA: Diagnosis not present

## 2018-03-27 DIAGNOSIS — S93401A Sprain of unspecified ligament of right ankle, initial encounter: Secondary | ICD-10-CM | POA: Diagnosis not present

## 2018-04-04 DIAGNOSIS — I83813 Varicose veins of bilateral lower extremities with pain: Secondary | ICD-10-CM | POA: Diagnosis not present

## 2018-05-23 ENCOUNTER — Other Ambulatory Visit: Payer: Self-pay | Admitting: Obstetrics and Gynecology

## 2018-05-23 DIAGNOSIS — Z1231 Encounter for screening mammogram for malignant neoplasm of breast: Secondary | ICD-10-CM

## 2018-05-24 ENCOUNTER — Other Ambulatory Visit: Payer: Self-pay | Admitting: Endocrinology

## 2018-06-29 ENCOUNTER — Ambulatory Visit (INDEPENDENT_AMBULATORY_CARE_PROVIDER_SITE_OTHER): Payer: BLUE CROSS/BLUE SHIELD | Admitting: Family

## 2018-06-29 ENCOUNTER — Encounter: Payer: Self-pay | Admitting: Family

## 2018-06-29 VITALS — BP 118/80 | HR 93 | Temp 97.9°F | Ht 64.5 in | Wt 147.1 lb

## 2018-06-29 DIAGNOSIS — J069 Acute upper respiratory infection, unspecified: Secondary | ICD-10-CM

## 2018-06-29 DIAGNOSIS — G43809 Other migraine, not intractable, without status migrainosus: Secondary | ICD-10-CM | POA: Diagnosis not present

## 2018-06-29 MED ORDER — KETOROLAC TROMETHAMINE 30 MG/ML IJ SOLN
30.0000 mg | Freq: Once | INTRAMUSCULAR | Status: AC
  Administered 2018-06-29: 30 mg via INTRAMUSCULAR

## 2018-06-29 MED ORDER — AZITHROMYCIN 250 MG PO TABS: ORAL_TABLET | ORAL | 0 refills | Status: DC

## 2018-06-29 NOTE — Addendum Note (Signed)
Addended by: Eustace MooreMURRAY, Rodrick Payson W on: 06/29/2018 04:34 PM   Modules accepted: Orders

## 2018-06-29 NOTE — Progress Notes (Signed)
Olivia Page is a 52 y.o. female with the following history as recorded in [employer]:  Patient Active Problem List   Diagnosis Date Noted  . Costochondral chest pain 03/23/2016  . Postsurgical hypothyroidism 10/01/2013  . CELLULITIS AND ABSCESS OF FACE 02/14/2009  . Essential hypertension 02/12/2009    Current Outpatient Medications  Medication Sig Dispense Refill  . ALPRAZolam (XANAX) 0.5 MG tablet Take 0.5 mg by mouth at bedtime as needed.    . cholecalciferol (VITAMIN D) 1000 units tablet Take 1,000 Units by mouth daily.    Marland Kitchen levothyroxine (SYNTHROID, LEVOTHROID) 112 MCG tablet TAKE ONE TABLET BY MOUTH DAILY EXCEPT ON SUNDAYS. ON SUNDAYS TAKE ONE-HALF TABLET. 90 tablet 0  . lisinopril (PRINIVIL,ZESTRIL) 10 MG tablet Take 10 mg by mouth daily.  0  . Multiple Vitamin (MULTIVITAMIN) tablet Take 1 tablet by mouth daily.    . ondansetron (ZOFRAN-ODT) 8 MG disintegrating tablet Take 1 tablet (8 mg total) by mouth every 8 (eight) hours as needed for nausea or vomiting. 12 tablet 0  . promethazine (PHENERGAN) 12.5 MG tablet Take 1 tablet (12.5 mg total) by mouth every 8 (eight) hours as needed for nausea or vomiting. 20 tablet 0  . azithromycin (ZITHROMAX) 250 MG tablet 2 tabs po qd x 1 day; 1 tablet per day x 4 days; 6 tablet 0   No current facility-administered medications for this visit.     Allergies: Sulfa antibiotics and Codeine  Past Medical History:  Diagnosis Date  . Anxiety   . Blood transfusion without reported diagnosis   . Hypertension     Past Surgical History:  Procedure Laterality Date  . ABDOMINAL HYSTERECTOMY    . BREAST SURGERY    . TOTAL THYROIDECTOMY  2011    Family History  Problem Relation Age of Onset  . Hypertension Mother   . Diverticulitis Mother   . Cancer Maternal Grandmother   . Cancer Maternal Grandfather   . Cancer Paternal Grandmother   . Cancer Paternal Grandfather   . Stroke Father 72  . Alcohol abuse Brother        in recovery     Social History   Tobacco Use  . Smoking status: Never Smoker  . Smokeless tobacco: Never Used  Substance Use Topics  . Alcohol use: No    Alcohol/week: 0.0 standard drinks    Subjective:  Patient presents with concerns for 5 day history of headache/ facial pressure; using Ibuprofen with limited benefit; does have history of migraines- uses Imitrex occasionally; feels like she has a sinus infection now also; cannot use OTC antihistamines, decongestants or nasal sprays; no fever;      Objective:  Vitals:   06/29/18 1554  BP: 118/80  Pulse: 93  Temp: 97.9 F (36.6 C)  TempSrc: Oral  SpO2: 99%  Weight: 147 lb 1.3 oz (66.7 kg)  Height: 5' 4.5" (1.638 m)    General: Well developed, well nourished, in no acute distress; appears to squint in office/ appears light-sensitive Skin : Warm and dry.  Head: Normocephalic and atraumatic  Eyes: Sclera and conjunctiva clear; pupils round and reactive to light; extraocular movements intact  Ears: External normal; canals clear; tympanic membranes normal  Oropharynx: Pink, supple. No suspicious lesions  Neck: Supple without thyromegaly, adenopathy  Lungs: Respirations unlabored; clear to auscultation bilaterally without wheeze, rales, rhonchi  CVS exam: normal rate and regular rhythm.  Neurologic: Alert and oriented; speech intact; face symmetrical; moves all extremities well; CNII-XII intact without focal deficit  Assessment:  1. Other migraine without status migrainosus, not intractable   2. Acute URI     Plan:  Symptoms are more c/w migraine that sinus infection; however, patient is adamant that she has infection; Toradol IM 30 mg given in office today; Rx for Z-pak given as requested;  In reviewing patient's chart, it appears that she has been having most of her needs managed by GYN/ [facility] at [facility]; encouraged her to have one PCP; she notes her healthcare needs are managed by her GYN and she only needs our office or [facility] for  urgent care needs; she asks about refill for Imitrex which apparently her GYN has been giving her- explained that since I am not managing this for her, she needs to talk to her GYN about refill.  Work note given as requested for today;   No follow-ups on file.  No orders of the defined types were placed in this encounter.   Requested Prescriptions   Signed Prescriptions Disp Refills  . azithromycin (ZITHROMAX) 250 MG tablet 6 tablet 0    Sig: 2 tabs po qd x 1 day; 1 tablet per day x 4 days;

## 2018-07-11 ENCOUNTER — Other Ambulatory Visit (INDEPENDENT_AMBULATORY_CARE_PROVIDER_SITE_OTHER): Payer: BLUE CROSS/BLUE SHIELD

## 2018-07-11 DIAGNOSIS — E890 Postprocedural hypothyroidism: Secondary | ICD-10-CM | POA: Diagnosis not present

## 2018-07-11 LAB — TSH: TSH: 0.12 u[IU]/mL — AB (ref 0.35–4.50)

## 2018-07-11 LAB — T4, FREE: Free T4: 1.28 ng/dL (ref 0.60–1.60)

## 2018-07-12 ENCOUNTER — Ambulatory Visit
Admission: RE | Admit: 2018-07-12 | Discharge: 2018-07-12 | Disposition: A | Discharge status: Home / Self Care | Payer: BLUE CROSS/BLUE SHIELD | Source: Ambulatory Visit | Attending: Obstetrics and Gynecology | Admitting: Obstetrics and Gynecology

## 2018-07-12 DIAGNOSIS — Z1231 Encounter for screening mammogram for malignant neoplasm of breast: Secondary | ICD-10-CM

## 2018-07-19 ENCOUNTER — Ambulatory Visit (INDEPENDENT_AMBULATORY_CARE_PROVIDER_SITE_OTHER): Payer: BLUE CROSS/BLUE SHIELD | Admitting: Endocrinology

## 2018-07-19 ENCOUNTER — Encounter: Payer: Self-pay | Admitting: Endocrinology

## 2018-07-19 VITALS — BP 126/82 | HR 75 | Ht 64.5 in | Wt 145.8 lb

## 2018-07-19 DIAGNOSIS — E890 Postprocedural hypothyroidism: Secondary | ICD-10-CM | POA: Diagnosis not present

## 2018-07-19 MED ORDER — SYNTHROID 100 MCG PO TABS: ORAL_TABLET | ORAL | 1 refills | Status: DC

## 2018-07-19 MED ORDER — LEVOTHYROXINE SODIUM 100 MCG PO TABS: ORAL_TABLET | ORAL | 3 refills | Status: DC

## 2018-07-19 NOTE — Progress Notes (Signed)
Patient ID: Olivia Page, female   DOB: Feb 23, 1966, 52 y.o.   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   The patient has been treated with Synthroid or levothyroxine initially in variable doses and has been on generic levothyroxine because of cost  She thinks her pharmacy is giving her the same manufacturer of levothyroxine  On the last visit she was taking 112 g prescription which she has been on since 09/2013; except that she was told to take 6-1/2 tablets a week because of low normal TSH She was switched to the 100 mcg prescription in 11/18  However it appears that the pharmacy requested a refill for the 112 prescription in October 2019 which she is taking She thinks she is taking 6-1/2 pills a week as on the prescription  Does not feel unusually fatigued Her weight has gone up since last year      The patient is taking the thyroid supplement regularly in the morning about 20-30 minutes before breakfast.   Not taking any calcium or iron supplements with the thyroid supplement.    Her TSH appears to be below normal now  Free T4 is relatively higher also  Wt Readings from Last 3 Encounters:  07/19/18 145 lb 12.8 oz (66.1 kg)  06/29/18 147 lb 1.3 oz (66.7 kg)  09/28/17 141 lb 3.2 oz (64 kg)    Lab Results  Component Value Date   TSH 0.12 (L) 07/11/2018   TSH 0.40 07/13/2017   TSH 0.47 09/27/2016   FREET4 1.28 07/11/2018   FREET4 1.13 07/13/2017   FREET4 1.20 09/27/2016     Allergies as of 07/19/2018      Reactions   Sulfa Antibiotics Nausea And Vomiting   Codeine    Violently ill      Medication List        Accurate as of 07/19/18  8:48 AM. Always use your most  recent med list.          ALPRAZolam 0.5 MG tablet Commonly known as:  XANAX Take 0.5 mg by mouth at bedtime as needed.   cholecalciferol 1000 units tablet Commonly known as:  VITAMIN D Take 1,000 Units by mouth daily.   levothyroxine 112 MCG tablet Commonly known as:  SYNTHROID, LEVOTHROID TAKE ONE TABLET BY MOUTH DAILY EXCEPT ON SUNDAYS. ON SUNDAYS TAKE ONE-HALF TABLET.   lisinopril 10 MG tablet Commonly known as:  PRINIVIL,ZESTRIL Take 10 mg by mouth daily.   multivitamin tablet Take 1 tablet by mouth daily.   ondansetron 8 MG disintegrating tablet Commonly known as:  ZOFRAN-ODT Take 1 tablet (8 mg total) by mouth every 8 (eight) hours as needed for nausea or vomiting.   promethazine 12.5 MG tablet Commonly known as:  PHENERGAN Take 1 tablet (12.5 mg total) by mouth every 8 (eight) hours as needed for nausea or vomiting.       Allergies:  Allergies  Allergen Reactions  . Sulfa Antibiotics Nausea And Vomiting  . Codeine     Violently ill    Past Medical History:  Diagnosis Date  . Anxiety   . Blood transfusion without reported diagnosis   . Hypertension     Past Surgical History:  Procedure Laterality Date  . ABDOMINAL HYSTERECTOMY    . BREAST SURGERY    . REDUCTION MAMMAPLASTY Bilateral   . TOTAL THYROIDECTOMY  2011    Family History  Problem Relation Age of Onset  . Hypertension Mother   . Diverticulitis Mother   . Cancer Maternal Grandmother   . Cancer Maternal Grandfather   . Cancer Paternal Grandmother   . Cancer Paternal Grandfather   . Stroke Father 1562  . Alcohol abuse Brother        in recovery    Social History:  reports that she has never smoked. She has never used smokeless tobacco. She reports that she does not drink alcohol or use drugs.  REVIEW Of SYSTEMS:   She has a history of hypertension followed by her gynecologist and treated with lisinopril 10 mg, no recent change   BP Readings from Last 3 Encounters:  07/19/18  126/82  06/29/18 118/80  09/28/17 110/72        Examination:   BP 126/82 (BP Location: Left Arm, Patient Position: Sitting, Cuff Size: Normal)   Pulse 75   Ht 5' 4.5" (1.638 m)   Wt 145 lb 12.8 oz (66.1 kg)   SpO2 98%   BMI 24.64 kg/m   She looks well Thyroid not palpable         biceps reflex on the right shows normal relaxation No tremor No dryness of skin No peripheral edema     Assessment/Plan:    Hypothyroidism, postsurgical   Although she is subjectively not having any symptoms her TSH is now low She thinks that she is taking 6-1/2 tablets a week with her 112 g levothyroxine Apparently her prescription was not filled for 100 mcg in October when 112 was requested by pharmacy  Since her average dose is 104 mcg daily and TSH is low she will switch to 100 mcg, 6-1/2 tablets a week now She wants to try a brand name Synthroid and this was sent  Follow-up in 3 months  There are no Patient Instructions on file for this visit.   Reather LittlerAjay Mahlani Berninger 07/19/2018, 8:48 AM

## 2018-07-21 DIAGNOSIS — Z6824 Body mass index (BMI) 24.0-24.9, adult: Secondary | ICD-10-CM | POA: Diagnosis not present

## 2018-07-21 DIAGNOSIS — N760 Acute vaginitis: Secondary | ICD-10-CM | POA: Diagnosis not present

## 2018-07-21 DIAGNOSIS — Z01419 Encounter for gynecological examination (general) (routine) without abnormal findings: Secondary | ICD-10-CM | POA: Diagnosis not present

## 2018-09-09 ENCOUNTER — Ambulatory Visit
Admission: EM | Admit: 2018-09-09 | Discharge: 2018-09-09 | Disposition: A | Discharge status: Home / Self Care | Payer: BLUE CROSS/BLUE SHIELD | Attending: Family Medicine | Admitting: Family Medicine

## 2018-09-09 ENCOUNTER — Other Ambulatory Visit: Payer: Self-pay

## 2018-09-09 DIAGNOSIS — R509 Fever, unspecified: Secondary | ICD-10-CM

## 2018-09-09 DIAGNOSIS — R0981 Nasal congestion: Secondary | ICD-10-CM

## 2018-09-09 DIAGNOSIS — R05 Cough: Secondary | ICD-10-CM

## 2018-09-09 DIAGNOSIS — R69 Illness, unspecified: Secondary | ICD-10-CM | POA: Insufficient documentation

## 2018-09-09 DIAGNOSIS — J111 Influenza due to unidentified influenza virus with other respiratory manifestations: Secondary | ICD-10-CM

## 2018-09-09 DIAGNOSIS — J3489 Other specified disorders of nose and nasal sinuses: Secondary | ICD-10-CM

## 2018-09-09 DIAGNOSIS — R5383 Other fatigue: Secondary | ICD-10-CM

## 2018-09-09 LAB — POCT INFLUENZA A/B
INFLUENZA A, POC: NEGATIVE
INFLUENZA B, POC: NEGATIVE

## 2018-09-09 MED ORDER — DM-GUAIFENESIN ER 30-600 MG PO TB12: 1.0000 | ORAL_TABLET | Freq: Two times a day (BID) | ORAL | 0 refills | Status: DC

## 2018-09-09 NOTE — ED Triage Notes (Signed)
Per pt she has been having nasal congestion, cough chills, and generalized body aches for 2 days.

## 2018-09-09 NOTE — Discharge Instructions (Signed)
Your flu test was negative We will go ahead and treat the congestion and cough with Mucinex DM You can do Tylenol or ibuprofen for body aches and fevers Work note given

## 2018-09-10 NOTE — ED Provider Notes (Signed)
[facility]    CSN: [id number] Arrival date & time: 09/09/18  1144     History   Chief Complaint Chief Complaint  Patient presents with  . Nasal Congestion  . Cough    HPI Olivia Page is a 53 y.o. female.    URI  Presenting symptoms: congestion, cough, fatigue, fever and rhinorrhea   Severity:  Moderate Duration:  2 days Timing:  Constant Progression:  Unchanged Chronicity:  New Relieved by:  Nothing Worsened by:  Nothing Ineffective treatments:  OTC medications Associated symptoms: headaches and myalgias   Risk factors: sick contacts   Risk factors comment:  Child was dx with flu   Past Medical History:  Diagnosis Date  . Anxiety   . Blood transfusion without reported diagnosis   . Hypertension     Patient Active Problem List   Diagnosis Date Noted  . Costochondral chest pain 03/23/2016  . Postsurgical hypothyroidism 10/01/2013  . CELLULITIS AND ABSCESS OF FACE 02/14/2009  . Essential hypertension 02/12/2009    Past Surgical History:  Procedure Laterality Date  . ABDOMINAL HYSTERECTOMY    . BREAST SURGERY    . REDUCTION MAMMAPLASTY Bilateral   . TOTAL THYROIDECTOMY  2011    OB History   No obstetric history on file.      Home Medications    Prior to Admission medications   Medication Sig Start Date End Date Taking? Authorizing Provider  ALPRAZolam Prudy Feeler) 0.5 MG tablet Take 0.5 mg by mouth at bedtime as needed.    [provider]  cholecalciferol (VITAMIN D) 1000 units tablet Take 1,000 Units by mouth daily.    [provider]  dextromethorphan-guaiFENesin (MUCINEX DM) 30-600 MG 12hr tablet Take 1 tablet by mouth 2 (two) times daily. 09/09/18   Dahlia Byes A, NP  lisinopril (PRINIVIL,ZESTRIL) 10 MG tablet Take 10 mg by mouth daily. 08/13/14   [provider]  Multiple Vitamin (MULTIVITAMIN) tablet Take 1 tablet by mouth daily.    [provider]  ondansetron (ZOFRAN-ODT) 8 MG disintegrating  tablet Take 1 tablet (8 mg total) by mouth every 8 (eight) hours as needed for nausea or vomiting. 09/28/17   McVey, Madelaine Bhat, PA-C  promethazine (PHENERGAN) 12.5 MG tablet Take 1 tablet (12.5 mg total) by mouth every 8 (eight) hours as needed for nausea or vomiting. 09/28/17   McVey, Madelaine Bhat, PA-C  SYNTHROID 100 MCG tablet Take 1 tablet before breakfast daily except half on Sundays 07/19/18   [name], MD    Family History Family History  Problem Relation Age of Onset  . Hypertension Mother   . Diverticulitis Mother   . Cancer Maternal Grandmother   . Cancer Maternal Grandfather   . Cancer Paternal Grandmother   . Cancer Paternal Grandfather   . Stroke Father 67  . Alcohol abuse Brother        in recovery    Social History Social History   Tobacco Use  . Smoking status: Never Smoker  . Smokeless tobacco: Never Used  Substance Use Topics  . Alcohol use: No    Alcohol/week: 0.0 standard drinks  . Drug use: No     Allergies   Sulfa antibiotics and Codeine   Review of Systems Review of Systems  Constitutional: Positive for fatigue and fever.  HENT: Positive for congestion and rhinorrhea.   Respiratory: Positive for cough.   Musculoskeletal: Positive for myalgias.  Neurological: Positive for headaches.     Physical Exam Triage Vital Signs ED  Triage Vitals  Enc Vitals Group     BP 09/09/18 1231 121/81     Pulse Rate 09/09/18 1231 91     Resp 09/09/18 1231 16     Temp 09/09/18 1231 98.4 F (36.9 C)     Temp Source 09/09/18 1231 Oral     SpO2 09/09/18 1231 93 %     Weight 09/09/18 1232 140 lb (63.5 kg)     Height 09/09/18 1232 5\' 4"  (1.626 m)     Head Circumference --      Peak Flow --      Pain Score 09/09/18 1232 3     Pain Loc --      Pain Edu? --      Excl. in GC? --    No data found.  Updated Vital Signs BP 121/81 (BP Location: Right Arm)   Pulse 91   Temp 98.4 F (36.9 C) (Oral)   Resp 16   Ht 5\' 4"  (1.626 m)   Wt 140 lb  (63.5 kg)   SpO2 93%   BMI 24.03 kg/m   Visual Acuity Right Eye Distance:   Left Eye Distance:   Bilateral Distance:    Right Eye Near:   Left Eye Near:    Bilateral Near:     Physical Exam Vitals signs and nursing note reviewed.  Constitutional:      General: She is not in acute distress.    Appearance: She is well-developed.  HENT:     Head: Normocephalic and atraumatic.     Right Ear: Tympanic membrane and ear canal normal.     Left Ear: Tympanic membrane and ear canal normal.     Mouth/Throat:     Pharynx: Oropharynx is clear.  Eyes:     Conjunctiva/sclera: Conjunctivae normal.  Neck:     Musculoskeletal: Neck supple.  Cardiovascular:     Rate and Rhythm: Normal rate and regular rhythm.     Heart sounds: No murmur.  Pulmonary:     Effort: Pulmonary effort is normal. No respiratory distress.     Breath sounds: Normal breath sounds.  Abdominal:     Palpations: Abdomen is soft.     Tenderness: There is no abdominal tenderness.  Musculoskeletal: Normal range of motion.  Skin:    General: Skin is warm and dry.     Findings: No rash.  Neurological:     Mental Status: She is alert.  Psychiatric:        Mood and Affect: Mood normal.      UC Treatments / Results  Labs (all labs ordered are listed, but only abnormal results are displayed) Labs Reviewed  POCT INFLUENZA A/B    EKG None  Radiology No results found.  Procedures Procedures (including critical care time)  Medications Ordered in UC Medications - No data to display  Initial Impression / Assessment and Plan / UC Course  I have reviewed the triage vital signs and the nursing notes.  Pertinent labs & imaging results that were available during my care of the patient were reviewed by me and considered in my medical decision making (see chart for details).     Flu test negative Most likely viral URI Symptomatic treatment  Follow up as needed for continued or worsening symptoms  Final  Clinical Impressions(s) / UC Diagnoses   Final diagnoses:  Influenza-like illness     Discharge Instructions     Your flu test was negative We will go ahead and treat the congestion  and cough with Mucinex DM You can do Tylenol or ibuprofen for body aches and fevers Work note given    ED Prescriptions    Medication Sig Dispense Auth. Provider   dextromethorphan-guaiFENesin (MUCINEX DM) 30-600 MG 12hr tablet Take 1 tablet by mouth 2 (two) times daily. 15 tablet Janace ArisBast, Mizael Sagar A, NP     Controlled Substance Prescriptions [place] Controlled Substance Registry consulted? no   Janace ArisBast, Luismario Coston A, NP 09/10/18 1429

## 2018-09-11 ENCOUNTER — Ambulatory Visit: Payer: BLUE CROSS/BLUE SHIELD | Admitting: Family Medicine

## 2018-09-27 DIAGNOSIS — I83813 Varicose veins of bilateral lower extremities with pain: Secondary | ICD-10-CM | POA: Diagnosis not present

## 2018-10-03 DIAGNOSIS — I83813 Varicose veins of bilateral lower extremities with pain: Secondary | ICD-10-CM | POA: Diagnosis not present

## 2018-10-17 ENCOUNTER — Other Ambulatory Visit: Payer: BLUE CROSS/BLUE SHIELD

## 2018-10-19 ENCOUNTER — Encounter: Payer: BLUE CROSS/BLUE SHIELD | Admitting: Endocrinology

## 2018-10-19 NOTE — Progress Notes (Signed)
This encounter was created in error - please disregard.

## 2018-10-27 ENCOUNTER — Ambulatory Visit
Admission: EM | Admit: 2018-10-27 | Discharge: 2018-10-27 | Disposition: A | Discharge status: Home / Self Care | Payer: BLUE CROSS/BLUE SHIELD | Attending: Physician Assistant | Admitting: Physician Assistant

## 2018-10-27 DIAGNOSIS — B349 Viral infection, unspecified: Secondary | ICD-10-CM

## 2018-10-27 DIAGNOSIS — R51 Headache: Secondary | ICD-10-CM

## 2018-10-27 DIAGNOSIS — J3489 Other specified disorders of nose and nasal sinuses: Secondary | ICD-10-CM | POA: Diagnosis not present

## 2018-10-27 MED ORDER — PREDNISONE 50 MG PO TABS: 50.0000 mg | ORAL_TABLET | Freq: Every day | ORAL | 0 refills | Status: DC

## 2018-10-27 MED ORDER — IPRATROPIUM BROMIDE 0.06 % NA SOLN: 2.0000 | Freq: Four times a day (QID) | NASAL | 12 refills | Status: DC

## 2018-10-27 MED ORDER — FLUTICASONE PROPIONATE 50 MCG/ACT NA SUSP: 2.0000 | Freq: Every day | NASAL | 0 refills | Status: DC

## 2018-10-27 MED ORDER — KETOROLAC TROMETHAMINE 60 MG/2ML IM SOLN
15.0000 mg | Freq: Once | INTRAMUSCULAR | Status: AC
  Administered 2018-10-27: 15 mg via INTRAMUSCULAR

## 2018-10-27 NOTE — ED Provider Notes (Signed)
[name]    CSN: [id number] Arrival date & time: 10/27/18  1115     History   Chief Complaint Chief Complaint  Patient presents with  . Cough    HPI Olivia Page is a 53 y.o. female.   53 year old female comes in for 2 day history of URI symptoms. Has had nasal congestion, sinus pressure, cough, body aches, sneezing. Has had subjective fever, chills. No obvious sick contact. Never smoker. Has been using saline mist without relief. History of seasonal allergies.      Past Medical History:  Diagnosis Date  . Anxiety   . Blood transfusion without reported diagnosis   . Hypertension     Patient Active Problem List   Diagnosis Date Noted  . Costochondral chest pain 03/23/2016  . Postsurgical hypothyroidism 10/01/2013  . CELLULITIS AND ABSCESS OF FACE 02/14/2009  . Essential hypertension 02/12/2009    Past Surgical History:  Procedure Laterality Date  . ABDOMINAL HYSTERECTOMY    . BREAST SURGERY    . REDUCTION MAMMAPLASTY Bilateral   . TOTAL THYROIDECTOMY  2011    OB History   No obstetric history on file.      Home Medications    Prior to Admission medications   Medication Sig Start Date End Date Taking? Authorizing Provider  ALPRAZolam Prudy Feeler) 0.5 MG tablet Take 0.5 mg by mouth at bedtime as needed.    [provider]  cholecalciferol (VITAMIN D) 1000 units tablet Take 1,000 Units by mouth daily.    [provider]  dextromethorphan-guaiFENesin (MUCINEX DM) 30-600 MG 12hr tablet Take 1 tablet by mouth 2 (two) times daily. 09/09/18   Bast, Gloris Manchester A, NP  fluticasone (FLONASE) 50 MCG/ACT nasal spray Place 2 sprays into both nostrils daily. 10/27/18   Cathie Hoops, Kadience Macchi V, PA-C  ipratropium (ATROVENT) 0.06 % nasal spray Place 2 sprays into both nostrils 4 (four) times daily. 10/27/18   Cathie Hoops, Nanna Ertle V, PA-C  lisinopril (PRINIVIL,ZESTRIL) 10 MG tablet Take 10 mg by mouth daily. 08/13/14   [provider]  Multiple Vitamin (MULTIVITAMIN)  tablet Take 1 tablet by mouth daily.    [provider]  ondansetron (ZOFRAN-ODT) 8 MG disintegrating tablet Take 1 tablet (8 mg total) by mouth every 8 (eight) hours as needed for nausea or vomiting. 09/28/17   McVey, Madelaine Bhat, PA-C  predniSONE (DELTASONE) 50 MG tablet Take 1 tablet (50 mg total) by mouth daily with breakfast. 10/27/18   Cathie Hoops, Kalimah Capurro V, PA-C  promethazine (PHENERGAN) 12.5 MG tablet Take 1 tablet (12.5 mg total) by mouth every 8 (eight) hours as needed for nausea or vomiting. 09/28/17   McVey, Madelaine Bhat, PA-C  SYNTHROID 100 MCG tablet Take 1 tablet before breakfast daily except half on Sundays 07/19/18   [name], MD    Family History Family History  Problem Relation Age of Onset  . Hypertension Mother   . Diverticulitis Mother   . Cancer Maternal Grandmother   . Cancer Maternal Grandfather   . Cancer Paternal Grandmother   . Cancer Paternal Grandfather   . Stroke Father 45  . Alcohol abuse Brother        in recovery    Social History Social History   Tobacco Use  . Smoking status: Never Smoker  . Smokeless tobacco: Never Used  Substance Use Topics  . Alcohol use: No    Alcohol/week: 0.0 standard drinks  . Drug use: No     Allergies   Sulfa antibiotics and Codeine  Review of Systems Review of Systems  Reason unable to perform ROS: See HPI as above.     Physical Exam Triage Vital Signs ED Triage Vitals [10/27/18 1127]  Enc Vitals Group     BP 121/71     Pulse Rate 80     Resp 20     Temp 98.2 F (36.8 C)     Temp Source Oral     SpO2      Weight      Height      Head Circumference      Peak Flow      Pain Score 7     Pain Loc      Pain Edu?      Excl. in GC?    No data found.  Updated Vital Signs BP 121/71 (BP Location: Left Arm)   Pulse 80   Temp 98.2 F (36.8 C) (Oral)   Resp 20   Physical Exam Constitutional:      General: She is not in acute distress.    Appearance: She is well-developed. She is not  ill-appearing, toxic-appearing or diaphoretic.  HENT:     Head: Normocephalic and atraumatic.     Right Ear: Tympanic membrane, ear canal and external ear normal. Tympanic membrane is not erythematous or bulging.     Left Ear: Tympanic membrane, ear canal and external ear normal. Tympanic membrane is not erythematous or bulging.     Nose: Rhinorrhea present.     Right Sinus: Maxillary sinus tenderness and frontal sinus tenderness present.     Left Sinus: Maxillary sinus tenderness and frontal sinus tenderness present.     Mouth/Throat:     Mouth: Mucous membranes are moist.     Pharynx: Oropharynx is clear. Uvula midline.  Eyes:     Conjunctiva/sclera: Conjunctivae normal.     Pupils: Pupils are equal, round, and reactive to light.  Neck:     Musculoskeletal: Normal range of motion and neck supple.  Cardiovascular:     Rate and Rhythm: Normal rate and regular rhythm.     Heart sounds: Normal heart sounds. No murmur. No friction rub. No gallop.   Pulmonary:     Effort: Pulmonary effort is normal. No accessory muscle usage, prolonged expiration, respiratory distress or retractions.     Breath sounds: Normal breath sounds. No stridor, decreased air movement or transmitted upper airway sounds. No decreased breath sounds, wheezing, rhonchi or rales.  Skin:    General: Skin is warm and dry.  Neurological:     Mental Status: She is alert and oriented to person, place, and time.      UC Treatments / Results  Labs (all labs ordered are listed, but only abnormal results are displayed) Labs Reviewed - No data to display  EKG None  Radiology No results found.  Procedures Procedures (including critical care time)  Medications Ordered in UC Medications  ketorolac (TORADOL) injection 15 mg (15 mg Intramuscular Given 10/27/18 1157)    Initial Impression / Assessment and Plan / UC Course  I have reviewed the triage vital signs and the nursing notes.  Pertinent labs & imaging results  that were available during my care of the patient were reviewed by me and considered in my medical decision making (see chart for details).    Toradol injection in office today for headache. Prednisone as directed for sinus pressure. Other symptomatic treatment discussed. Push fluids. Return precautions given.  Final Clinical Impressions(s) / UC Diagnoses   Final  diagnoses:  Sinus pressure  Viral illness    ED Prescriptions    Medication Sig Dispense Auth. Provider   fluticasone (FLONASE) 50 MCG/ACT nasal spray Place 2 sprays into both nostrils daily. 1 g Evita Merida V, PA-C   predniSONE (DELTASONE) 50 MG tablet Take 1 tablet (50 mg total) by mouth daily with breakfast. 5 tablet Janely Gullickson V, PA-C   ipratropium (ATROVENT) 0.06 % nasal spray Place 2 sprays into both nostrils 4 (four) times daily. 15 mL Threasa Alpha, [place] 10/27/18 1219

## 2018-10-27 NOTE — Discharge Instructions (Addendum)
Start prednisone as directed. Start flonase, atrovent nasal spray for nasal congestion/drainage. You can use over the counter nasal saline rinse such as neti pot for nasal congestion. Can also take over the counter allergy medicine such as zyrtec. Keep hydrated, your urine should be clear to pale yellow in color. Tylenol/motrin for fever and pain. Monitor for any worsening of symptoms, chest pain, shortness of breath, wheezing, swelling of the throat, follow up for reevaluation.  If still having nasal congestion, you can use over the counter afrin for the next 1-3 days to help with nasal congestion. Do not use more than 3 days as it can cause rebound congestion.  For sore throat/cough try using a honey-based tea. Use 3 teaspoons of honey with juice squeezed from half lemon. Place shaved pieces of ginger into 1/2-1 cup of water and warm over stove top. Then mix the ingredients and repeat every 4 hours as needed.

## 2018-10-27 NOTE — ED Triage Notes (Signed)
Pt c/o cough, nasal/chest congestion and body aches x2 days

## 2018-12-19 ENCOUNTER — Other Ambulatory Visit (INDEPENDENT_AMBULATORY_CARE_PROVIDER_SITE_OTHER): Payer: BLUE CROSS/BLUE SHIELD

## 2018-12-19 ENCOUNTER — Other Ambulatory Visit: Payer: Self-pay

## 2018-12-19 ENCOUNTER — Other Ambulatory Visit: Payer: Self-pay | Admitting: Endocrinology

## 2018-12-19 DIAGNOSIS — E890 Postprocedural hypothyroidism: Secondary | ICD-10-CM | POA: Diagnosis not present

## 2018-12-19 LAB — T4, FREE: Free T4: 1.13 ng/dL (ref 0.60–1.60)

## 2018-12-19 LAB — TSH: TSH: 1.3 u[IU]/mL (ref 0.35–4.50)

## 2018-12-21 ENCOUNTER — Encounter: Payer: Self-pay | Admitting: Endocrinology

## 2018-12-21 ENCOUNTER — Ambulatory Visit (INDEPENDENT_AMBULATORY_CARE_PROVIDER_SITE_OTHER): Payer: BLUE CROSS/BLUE SHIELD | Admitting: Endocrinology

## 2018-12-21 DIAGNOSIS — E890 Postprocedural hypothyroidism: Secondary | ICD-10-CM | POA: Diagnosis not present

## 2018-12-21 NOTE — Progress Notes (Signed)
Patient ID: Olivia Page, female   DOB: 07-Dec-1965, 53 y.o.   MRN: [id number]   Today's office visit was provided via telemedicine using video technique Explained to the patient and the the limitations of evaluation and management by telemedicine and the availability of in person appointments.  The patient understood the limitations and agreed to proceed. Patient also understood that the telehealth visit is billable. . Location of the patient: Home . 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   The patient has been treated with Synthroid or levothyroxine initially in variable doses  On the last visit she was taking 112 g generic levothyroxine However she wanted to switch to the brand-name SYNTHROID  She was supposed to be on 100 mcg on her previous visit but her pharmacy refilled a prescription for 112 mcg causing her TSH to be 0.12  She is now taking her brand name Synthroid 100 mcg since 12/19 She confirms she is taking 6-1/2 pills a week as on the prescription  Does not feel recently fatigued Her weight has fluctuated, may have gone up 5 pounds in the last few weeks because of staying at home      She is taking the thyroid supplement regularly in the morning about 20-30 minutes before breakfast.   Not taking any calcium or iron supplements with the thyroid supplement.    Her TSH is back to normal at 1.3  Wt Readings from Last 3 Encounters:  09/09/18 140 lb (63.5 kg)  07/19/18 145 lb 12.8 oz (66.1 kg)  06/29/18 147 lb 1.3 oz (66.7 kg)    Lab Results  Component Value Date   TSH 1.30  12/19/2018   TSH 0.12 (L) 07/11/2018   TSH 0.40 07/13/2017   FREET4 1.13 12/19/2018   FREET4 1.28 07/11/2018   FREET4 1.13 07/13/2017     Allergies as of 12/21/2018      Reactions   Sulfa Antibiotics Nausea And Vomiting   Codeine    Violently ill      Medication List       Accurate as of Dec 21, 2018  8:27 AM. If you have any questions, ask your nurse or doctor.        STOP taking these medications   predniSONE 50 MG tablet Commonly known as:  DELTASONE     TAKE these medications   ALPRAZolam 0.5 MG tablet Commonly known as:  XANAX Take 0.5 mg by mouth at bedtime as needed.   cholecalciferol 1000 units tablet Commonly known as:  VITAMIN D Take 1,000 Units by mouth daily.   dextromethorphan-guaiFENesin 30-600 MG 12hr tablet Commonly known as:  MUCINEX DM Take 1 tablet by mouth 2 (two) times daily.   fluticasone 50 MCG/ACT nasal spray Commonly known as:  FLONASE Place 2 sprays into both nostrils daily.   ipratropium 0.06 % nasal spray Commonly known as:  Atrovent Place 2 sprays into both nostrils 4 (four) times daily.   lisinopril 10 MG tablet Commonly known as:  ZESTRIL Take 10 mg by mouth daily.   multivitamin tablet Take 1 tablet by mouth daily.   ondansetron  8 MG disintegrating tablet Commonly known as:  ZOFRAN-ODT Take 1 tablet (8 mg total) by mouth every 8 (eight) hours as needed for nausea or vomiting.   promethazine 12.5 MG tablet Commonly known as:  PHENERGAN Take 1 tablet (12.5 mg total) by mouth every 8 (eight) hours as needed for nausea or vomiting.   Synthroid 100 MCG tablet Generic drug:  levothyroxine TAKE 1 TABLET BEFORE BREAKFAST DAILY EXCEPT HALF ON SUNDAYS       Allergies:  Allergies  Allergen Reactions  . Sulfa Antibiotics Nausea And Vomiting  . Codeine     Violently ill    Past Medical History:  Diagnosis Date  . Anxiety   . Blood transfusion without reported diagnosis   . Hypertension     Past Surgical History:   Procedure Laterality Date  . ABDOMINAL HYSTERECTOMY    . BREAST SURGERY    . REDUCTION MAMMAPLASTY Bilateral   . TOTAL THYROIDECTOMY  2011    Family History  Problem Relation Age of Onset  . Hypertension Mother   . Diverticulitis Mother   . Cancer Maternal Grandmother   . Cancer Maternal Grandfather   . Cancer Paternal Grandmother   . Cancer Paternal Grandfather   . Stroke Father 97  . Alcohol abuse Brother        in recovery    Social History:  reports that she has never smoked. She has never used smokeless tobacco. She reports that she does not drink alcohol or use drugs.  REVIEW Of SYSTEMS:   She has a history of hypertension followed by her gynecologist and treated with lisinopril 10 mg  BP Readings from Last 3 Encounters:  10/27/18 121/71  09/09/18 121/81  07/19/18 126/82   She is asking about clicking in her legs when she is walking and asking about doing bone density She does not have a PCP   Examination:   There were no vitals taken for this visit.     Assessment/Plan:    Hypothyroidism, postsurgical   Her dosage was reduced on her last visit in 12/19 Subjectively doing well and does not feel different with her dosage change She prefers brand name Synthroid and is taking this now  Currently taking 6-1/2 tablets a week of 100 mcg dosage and is very consistent with taking it daily  TSH is excellent at 1.3  She can continue same dose and follow-up annually now  There are no Patient Instructions on file for this visit.   [name] 12/21/2018, 8:27 AM

## 2019-05-16 DIAGNOSIS — H6123 Impacted cerumen, bilateral: Secondary | ICD-10-CM | POA: Diagnosis not present

## 2019-05-25 ENCOUNTER — Other Ambulatory Visit: Payer: Self-pay | Admitting: Endocrinology

## 2019-06-04 ENCOUNTER — Other Ambulatory Visit: Payer: Self-pay | Admitting: Obstetrics and Gynecology

## 2019-06-04 DIAGNOSIS — Z1231 Encounter for screening mammogram for malignant neoplasm of breast: Secondary | ICD-10-CM

## 2019-06-05 ENCOUNTER — Other Ambulatory Visit: Payer: Self-pay | Admitting: Registered"

## 2019-06-05 DIAGNOSIS — Z20828 Contact with and (suspected) exposure to other viral communicable diseases: Secondary | ICD-10-CM | POA: Diagnosis not present

## 2019-06-05 DIAGNOSIS — Z20822 Contact with and (suspected) exposure to covid-19: Secondary | ICD-10-CM

## 2019-06-06 LAB — NOVEL CORONAVIRUS, NAA: SARS-CoV-2, NAA: NOT DETECTED

## 2019-06-12 ENCOUNTER — Telehealth: Payer: Self-pay | Admitting: General Practice

## 2019-06-12 NOTE — Telephone Encounter (Signed)
° °  Pt given neg COVID results  °

## 2019-06-22 ENCOUNTER — Telehealth: Payer: Self-pay

## 2019-06-22 NOTE — Telephone Encounter (Signed)
We can schedule her for labs and then a virtual visit

## 2019-06-22 NOTE — Telephone Encounter (Signed)
Called pt and gave her MD message. Pt verbalized understanding and did agree to bloodwork and virtual visit, but pt stated that her husband just had back surgery and she will have to come for blood work sometime next week. Pt will call the day of and have herself added to the lab schedule.

## 2019-06-22 NOTE — Telephone Encounter (Signed)
Patient called in stating she think her numbers and levels are off. she's been gaining a lot of weight and her energy levels are all over the place. Wants to know if she can be seen before visit or come get labs done.    Please advise

## 2019-06-28 NOTE — Telephone Encounter (Signed)
Patient scheduled for labs on Friday 06/29/2019 AM/.  Patient did ask if there was a reason for a VV if the labs are done? She stated that she feels if changes in medication are needed they could be done due to the lab results.  Requests a call call with results once labs are seen by Dr Dwyane Dee

## 2019-06-29 ENCOUNTER — Other Ambulatory Visit: Payer: Self-pay

## 2019-06-29 ENCOUNTER — Other Ambulatory Visit: Payer: Self-pay | Admitting: Endocrinology

## 2019-06-29 ENCOUNTER — Other Ambulatory Visit (INDEPENDENT_AMBULATORY_CARE_PROVIDER_SITE_OTHER): Payer: BLUE CROSS/BLUE SHIELD

## 2019-06-29 DIAGNOSIS — E890 Postprocedural hypothyroidism: Secondary | ICD-10-CM | POA: Diagnosis not present

## 2019-06-29 LAB — TSH: TSH: 0.6 u[IU]/mL (ref 0.35–4.50)

## 2019-06-29 LAB — T4, FREE: Free T4: 1.23 ng/dL (ref 0.60–1.60)

## 2019-07-02 NOTE — Progress Notes (Signed)
Please call to let patient know that the lab results are normal, to talk with pcp

## 2019-07-16 ENCOUNTER — Ambulatory Visit
Admission: EM | Admit: 2019-07-16 | Discharge: 2019-07-16 | Disposition: A | Discharge status: Home / Self Care | Payer: BLUE CROSS/BLUE SHIELD | Attending: Emergency Medicine | Admitting: Emergency Medicine

## 2019-07-16 ENCOUNTER — Encounter: Payer: Self-pay | Admitting: Emergency Medicine

## 2019-07-16 ENCOUNTER — Other Ambulatory Visit: Payer: Self-pay

## 2019-07-16 DIAGNOSIS — Z20828 Contact with and (suspected) exposure to other viral communicable diseases: Secondary | ICD-10-CM | POA: Diagnosis not present

## 2019-07-16 DIAGNOSIS — Z20822 Contact with and (suspected) exposure to covid-19: Secondary | ICD-10-CM

## 2019-07-16 DIAGNOSIS — J029 Acute pharyngitis, unspecified: Secondary | ICD-10-CM | POA: Diagnosis not present

## 2019-07-16 LAB — POC SARS CORONAVIRUS 2 AG -  ED: SARS Coronavirus 2 Ag: NEGATIVE

## 2019-07-16 LAB — POCT RAPID STREP A (OFFICE): Rapid Strep A Screen: NEGATIVE

## 2019-07-16 NOTE — Discharge Instructions (Signed)
Your COVID test is pending - it is important to quarantine / isolate at home until your results are back. °If you test positive and would like further evaluation for persistent or worsening symptoms, you may schedule an E-visit or virtual (video) visit throughout the [facility] MyChart app or website. ° °PLEASE NOTE: If you develop severe chest pain or shortness of breath please go to the ER or call 9-1-1 for further evaluation --> DO NOT schedule electronic or virtual visits for this. °Please call our office for further guidance / recommendations as needed. °

## 2019-07-16 NOTE — ED Provider Notes (Signed)
[name]    CSN: [id number] Arrival date & time: 07/16/19  1138      History   Chief Complaint Chief Complaint  Patient presents with  . Flu-Like Symptoms    HPI Olivia Page is a 53 y.o. female with history of hypertension, anxiety presenting for 2 to 3-day course of body aches, scratchy throat, nasal congestion, headache.  Patient states 2 children at home have tested positive for COVID-19 and strep throat.  Patient reports being a carrier of strep throat: Endorses sore throat, exudate.  Subjective fever, though does not have thermometer to check.  Has had mild, dry cough without shortness of breath or chest pain.  Has tried OTC medications with mild relief of symptoms.     Past Medical History:  Diagnosis Date  . Anxiety   . Blood transfusion without reported diagnosis   . Hypertension     Patient Active Problem List   Diagnosis Date Noted  . Costochondral chest pain 03/23/2016  . Postsurgical hypothyroidism 10/01/2013  . CELLULITIS AND ABSCESS OF FACE 02/14/2009  . Essential hypertension 02/12/2009    Past Surgical History:  Procedure Laterality Date  . ABDOMINAL HYSTERECTOMY    . BREAST SURGERY    . REDUCTION MAMMAPLASTY Bilateral   . TOTAL THYROIDECTOMY  2011    OB History   No obstetric history on file.      Home Medications    Prior to Admission medications   Medication Sig Start Date End Date Taking? Authorizing Provider  ALPRAZolam Prudy Feeler) 0.5 MG tablet Take 0.5 mg by mouth at bedtime as needed.    [provider]  cholecalciferol (VITAMIN D) 1000 units tablet Take 1,000 Units by mouth daily.    [provider]  fluticasone (FLONASE) 50 MCG/ACT nasal spray Place 2 sprays into both nostrils daily. 10/27/18   Cathie Hoops, Amy V, PA-C  ipratropium (ATROVENT) 0.06 % nasal spray Place 2 sprays into both nostrils 4 (four) times daily. 10/27/18   Cathie Hoops, Amy V, PA-C  lisinopril (PRINIVIL,ZESTRIL) 10 MG tablet Take 10 mg by mouth  daily. 08/13/14   [provider]  Multiple Vitamin (MULTIVITAMIN) tablet Take 1 tablet by mouth daily.    [provider]  ondansetron (ZOFRAN-ODT) 8 MG disintegrating tablet Take 1 tablet (8 mg total) by mouth every 8 (eight) hours as needed for nausea or vomiting. 09/28/17   McVey, Madelaine Bhat, PA-C  promethazine (PHENERGAN) 12.5 MG tablet Take 1 tablet (12.5 mg total) by mouth every 8 (eight) hours as needed for nausea or vomiting. 09/28/17   McVey, Madelaine Bhat, PA-C  SYNTHROID 100 MCG tablet TAKE 1 TABLET BEFORE BREAKFAST DAILY EXCEPT HALF ON SUNDAYS 05/25/19   [name], MD    Family History Family History  Problem Relation Age of Onset  . Hypertension Mother   . Diverticulitis Mother   . Cancer Maternal Grandmother   . Cancer Maternal Grandfather   . Cancer Paternal Grandmother   . Cancer Paternal Grandfather   . Stroke Father 43  . Alcohol abuse Brother        in recovery    Social History Social History   Tobacco Use  . Smoking status: Never Smoker  . Smokeless tobacco: Never Used  Substance Use Topics  . Alcohol use: No    Alcohol/week: 0.0 standard drinks  . Drug use: No     Allergies   Sulfa antibiotics and Codeine   Review of Systems Review of Systems  Constitutional: Negative for fatigue and  fever.  HENT: Positive for congestion and sore throat. Negative for dental problem, ear pain, facial swelling, hearing loss, sinus pain, trouble swallowing and voice change.   Eyes: Negative for photophobia, pain and visual disturbance.  Respiratory: Negative for cough and shortness of breath.   Cardiovascular: Negative for chest pain and palpitations.  Gastrointestinal: Negative for diarrhea and vomiting.  Musculoskeletal: Positive for myalgias. Negative for arthralgias, neck pain and neck stiffness.  Neurological: Positive for headaches. Negative for dizziness and light-headedness.     Physical Exam Triage Vital Signs ED Triage  Vitals [07/16/19 1216]  Enc Vitals Group     BP 129/79     Pulse Rate 78     Resp 18     Temp 97.9 F (36.6 C)     Temp Source Temporal     SpO2 96 %     Weight      Height      Head Circumference      Peak Flow      Pain Score 5     Pain Loc      Pain Edu?      Excl. in GC?    No data found.  Updated Vital Signs BP 129/79 (BP Location: Right Arm)   Pulse 78   Temp 97.9 F (36.6 C) (Temporal)   Resp 18   SpO2 96%   Visual Acuity Right Eye Distance:   Left Eye Distance:   Bilateral Distance:    Right Eye Near:   Left Eye Near:    Bilateral Near:     Physical Exam Constitutional:      General: She is not in acute distress. HENT:     Head: Normocephalic and atraumatic.     Jaw: There is normal jaw occlusion. No tenderness or pain on movement.     Right Ear: Hearing, tympanic membrane, ear canal and external ear normal. No tenderness. No mastoid tenderness.     Left Ear: Hearing, tympanic membrane, ear canal and external ear normal. No tenderness. No mastoid tenderness.     Nose: No nasal deformity, septal deviation or nasal tenderness.     Right Turbinates: Not swollen or pale.     Left Turbinates: Not swollen or pale.     Right Sinus: No maxillary sinus tenderness or frontal sinus tenderness.     Left Sinus: No maxillary sinus tenderness or frontal sinus tenderness.     Mouth/Throat:     Lips: Pink. No lesions.     Mouth: Mucous membranes are moist. No injury.     Pharynx: Oropharynx is clear. Uvula midline. No posterior oropharyngeal erythema or uvula swelling.     Comments: Tonsils 2+ with mild erythema.  Exudate on left tonsil. Neck:     Musculoskeletal: Normal range of motion and neck supple. No muscular tenderness.     Comments: Shotty, nontender anterior cervical lymphadenopathy Cardiovascular:     Rate and Rhythm: Normal rate.  Pulmonary:     Effort: Pulmonary effort is normal.  Lymphadenopathy:     Cervical: Cervical adenopathy present.   Neurological:     Mental Status: She is alert and oriented to person, place, and time.      UC Treatments / Results  Labs (all labs ordered are listed, but only abnormal results are displayed) Labs Reviewed  POC SARS CORONAVIRUS 2 AG -  ED - Normal  POCT RAPID STREP A (OFFICE) - Normal  NOVEL CORONAVIRUS, NAA  CULTURE, GROUP A STREP [facility])  EKG   Radiology No results found.  Procedures Procedures (including critical care time)  Medications Ordered in UC Medications - No data to display  Initial Impression / Assessment and Plan / UC Course  I have reviewed the triage vital signs and the nursing notes.  Pertinent labs & imaging results that were available during my care of the patient were reviewed by me and considered in my medical decision making (see chart for details).     Patient afebrile, nontoxic in office.  POC Covid done in office, reviewed by me: Negative-PCR pending.  Rapid strep done in office: Negative-culture pending.  Will treat supportively as discussed at appointment.  Patient to quarantine given symptom and Covid exposures.  Return precautions discussed, patient verbalized understanding and is agreeable to plan. Final Clinical Impressions(s) / UC Diagnoses   Final diagnoses:  Close exposure to COVID-19 virus  Sore throat     Discharge Instructions     Your COVID test is pending - it is important to quarantine / isolate at home until your results are back. If you test positive and would like further evaluation for persistent or worsening symptoms, you may schedule an E-visit or virtual (video) visit throughout the [facility] app or website.  PLEASE NOTE: If you develop severe chest pain or shortness of breath please go to the ER or call 9-1-1 for further evaluation --> DO NOT schedule electronic or virtual visits for this. Please call our office for further guidance / recommendations as needed.    ED Prescriptions    None     PDMP  not reviewed this encounter.   Neldon Mc [place], [place] 07/16/19 1415

## 2019-07-16 NOTE — ED Notes (Signed)
Patient able to ambulate independently  

## 2019-07-16 NOTE — ED Triage Notes (Signed)
Pt presents to [facility] for assessment of body aches, scratchy throat, nasal congestion, and headache x 2-3 days.  Patient's two kids are currently positive for COVID 19 and Strep throat

## 2019-07-17 LAB — NOVEL CORONAVIRUS, NAA: SARS-CoV-2, NAA: NOT DETECTED

## 2019-07-18 LAB — CULTURE, GROUP A STREP (THRC)

## 2019-07-24 ENCOUNTER — Ambulatory Visit: Payer: BLUE CROSS/BLUE SHIELD

## 2019-09-06 ENCOUNTER — Ambulatory Visit
Admission: RE | Admit: 2019-09-06 | Discharge: 2019-09-06 | Disposition: A | Discharge status: Home / Self Care | Payer: BLUE CROSS/BLUE SHIELD | Source: Ambulatory Visit | Attending: Obstetrics and Gynecology | Admitting: Obstetrics and Gynecology

## 2019-09-06 ENCOUNTER — Other Ambulatory Visit: Payer: Self-pay

## 2019-09-06 DIAGNOSIS — Z1231 Encounter for screening mammogram for malignant neoplasm of breast: Secondary | ICD-10-CM

## 2019-09-10 ENCOUNTER — Other Ambulatory Visit: Payer: Self-pay | Admitting: Obstetrics and Gynecology

## 2019-09-10 DIAGNOSIS — R928 Other abnormal and inconclusive findings on diagnostic imaging of breast: Secondary | ICD-10-CM

## 2019-09-13 ENCOUNTER — Other Ambulatory Visit: Payer: Self-pay

## 2019-09-13 ENCOUNTER — Ambulatory Visit: Payer: BC Managed Care – PPO

## 2019-09-13 ENCOUNTER — Other Ambulatory Visit: Payer: Self-pay | Admitting: Obstetrics and Gynecology

## 2019-09-13 ENCOUNTER — Ambulatory Visit
Admission: RE | Admit: 2019-09-13 | Discharge: 2019-09-13 | Disposition: A | Discharge status: Home / Self Care | Payer: BC Managed Care – PPO | Source: Ambulatory Visit | Attending: Obstetrics and Gynecology | Admitting: Obstetrics and Gynecology

## 2019-09-13 DIAGNOSIS — R928 Other abnormal and inconclusive findings on diagnostic imaging of breast: Secondary | ICD-10-CM

## 2019-09-18 ENCOUNTER — Other Ambulatory Visit: Payer: BC Managed Care – PPO

## 2019-09-25 DIAGNOSIS — Z01419 Encounter for gynecological examination (general) (routine) without abnormal findings: Secondary | ICD-10-CM | POA: Diagnosis not present

## 2019-09-25 DIAGNOSIS — G43909 Migraine, unspecified, not intractable, without status migrainosus: Secondary | ICD-10-CM | POA: Insufficient documentation

## 2019-09-25 DIAGNOSIS — I10 Essential (primary) hypertension: Secondary | ICD-10-CM | POA: Insufficient documentation

## 2019-09-25 DIAGNOSIS — Z6828 Body mass index (BMI) 28.0-28.9, adult: Secondary | ICD-10-CM | POA: Diagnosis not present

## 2019-09-25 DIAGNOSIS — Z9089 Acquired absence of other organs: Secondary | ICD-10-CM | POA: Insufficient documentation

## 2019-09-25 DIAGNOSIS — E890 Postprocedural hypothyroidism: Secondary | ICD-10-CM | POA: Insufficient documentation

## 2019-09-25 DIAGNOSIS — N390 Urinary tract infection, site not specified: Secondary | ICD-10-CM | POA: Diagnosis not present

## 2019-11-05 ENCOUNTER — Other Ambulatory Visit: Payer: Self-pay | Admitting: Endocrinology

## 2019-12-13 ENCOUNTER — Other Ambulatory Visit: Payer: Self-pay | Admitting: Endocrinology

## 2019-12-13 DIAGNOSIS — E890 Postprocedural hypothyroidism: Secondary | ICD-10-CM

## 2019-12-18 ENCOUNTER — Other Ambulatory Visit: Payer: BLUE CROSS/BLUE SHIELD

## 2019-12-21 ENCOUNTER — Ambulatory Visit: Payer: BLUE CROSS/BLUE SHIELD | Admitting: Endocrinology

## 2019-12-27 ENCOUNTER — Encounter: Payer: Self-pay | Admitting: Obstetrics and Gynecology

## 2020-01-15 ENCOUNTER — Other Ambulatory Visit: Payer: Self-pay | Admitting: Endocrinology

## 2020-01-15 NOTE — Telephone Encounter (Signed)
Pt has not been seen since 12/2019. Does pt need to be seen first?

## 2020-01-15 NOTE — Telephone Encounter (Signed)
May approve 30-day prescription with no refill Needs to make appointment with labs

## 2020-01-15 NOTE — Telephone Encounter (Signed)
Noted  

## 2020-01-30 ENCOUNTER — Other Ambulatory Visit: Payer: Self-pay | Admitting: Endocrinology

## 2020-01-30 ENCOUNTER — Telehealth: Payer: Self-pay | Admitting: Endocrinology

## 2020-01-30 DIAGNOSIS — E559 Vitamin D deficiency, unspecified: Secondary | ICD-10-CM

## 2020-01-30 NOTE — Telephone Encounter (Signed)
Patient is planning to get her labs done this Friday at [facility] - she is wondering if she could have her Vitamin D checked as well and if it could be added to her chart to be completed for Friday.

## 2020-02-06 ENCOUNTER — Telehealth: Payer: BC Managed Care – PPO | Admitting: Endocrinology

## 2020-02-16 ENCOUNTER — Other Ambulatory Visit: Payer: Self-pay | Admitting: Endocrinology

## 2020-02-28 ENCOUNTER — Other Ambulatory Visit (INDEPENDENT_AMBULATORY_CARE_PROVIDER_SITE_OTHER): Payer: BC Managed Care – PPO

## 2020-02-28 ENCOUNTER — Telehealth: Payer: Self-pay | Admitting: Endocrinology

## 2020-02-28 DIAGNOSIS — E890 Postprocedural hypothyroidism: Secondary | ICD-10-CM | POA: Diagnosis not present

## 2020-02-28 DIAGNOSIS — E559 Vitamin D deficiency, unspecified: Secondary | ICD-10-CM | POA: Diagnosis not present

## 2020-02-28 LAB — VITAMIN D 25 HYDROXY (VIT D DEFICIENCY, FRACTURES): VITD: 61.03 ng/mL (ref 30.00–100.00)

## 2020-02-28 LAB — T4, FREE: Free T4: 1.13 ng/dL (ref 0.60–1.60)

## 2020-02-28 LAB — TSH: TSH: 2.99 u[IU]/mL (ref 0.35–4.50)

## 2020-02-28 NOTE — Telephone Encounter (Signed)
Patient called again asking if she needs to be virtual or in person - patient states she only has 5 pills left and she needs to know if she could have more called in for her.

## 2020-02-28 NOTE — Telephone Encounter (Signed)
Patient called requesting another Rx for synthroid to be called until her appointment on 7/27. Patient wanted to also make sure it was ok to do virtual or if Dr Lucianne Muss wanted to see her in person - she stated she has gained some weight and did not know if he wanted to see her. Ph# [phone]

## 2020-02-28 NOTE — Telephone Encounter (Signed)
Patient called again to advise that she is going to the Lab at [facility] today.  Patient is again requesting another Rx for synthroid to be called until her appointment on 7/27.  Patient wanted to also make sure it was ok to do virtual or if Dr Lucianne Muss wanted to see her in person - she stated she has gained some weight and did not know if he wanted to see her. Ph# [phone]

## 2020-02-28 NOTE — Telephone Encounter (Signed)
error 

## 2020-02-29 NOTE — Telephone Encounter (Signed)
Please advise patient that she needs to complete her visit in person. An Rx has been sent to sustain her to next visit. If patient still have medication, this does not constitute an emergency. All calls must be answered and returned with regard to priority and urgency of the patient's condition and situation.

## 2020-02-29 NOTE — Telephone Encounter (Signed)
Dr Lucianne Muss actually approved a virtual visit for this patient prior to the note of 11:49 - patient has RX in hand and virtual visit confirmed.  Thanks for your assistance !

## 2020-03-02 NOTE — Progress Notes (Signed)
Patient ID: Olivia Page, female   DOB: 1966-02-25, 54 y.o.   MRN: [id number]  I connected with the above-named patient by video enabled telemedicine application and verified that I am speaking with the correct person. The patient was explained the limitations of evaluation and management by telemedicine and the availability of in person appointments.  Patient also understood that there may be a patient responsible charge related to this service . Location of the patient: Patient's home . Location of the provider: Physician office Only the patient and myself were participating in the encounter The patient understood the above statements and agreed to proceed.   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   The patient has been treated with Synthroid or levothyroxine initially in variable doses  In 2019 she wanted to switch to the brand-name SYNTHROID  She is now taking her brand name Synthroid 100 mcg since 12/19 She is taking 6-1/2 pills a week as on the prescription, with 1/2 tablet on Sundays  For several months she has had difficulty with fatigue although this is more intermittent last year She did have Covid infection last year  Her weight has apparently gone up 20-25 pounds although she does not know exactly how much Also dealing with menopausal symptoms      She is taking the Synthroid regularly in the morning about 20-30 minutes before breakfast. She may have missed her medication for couple of days as the pharmacy did not have a refill but this was after her labs were done  Not taking any calcium or iron supplements with the thyroid supplement.    Her TSH is still normal although relatively  higher at 3.0  Wt Readings from Last 3 Encounters:  09/09/18 140 lb (63.5 kg)  07/19/18 145 lb 12.8 oz (66.1 kg)  06/29/18 147 lb 1.3 oz (66.7 kg)    Lab Results  Component Value Date   TSH 2.99 02/28/2020   TSH 0.60 06/29/2019   TSH 1.30 12/19/2018   FREET4 1.13 02/28/2020   FREET4 1.23 06/29/2019   FREET4 1.13 12/19/2018     Allergies as of 03/03/2020      Reactions   Sulfa Antibiotics Nausea And Vomiting   Codeine    Violently ill      Medication List       Accurate as of March 02, 2020  6:14 PM. If you have any questions, ask your nurse or doctor.        ALPRAZolam 0.5 MG tablet Commonly known as: XANAX Take 0.5 mg by mouth at bedtime as needed.   cholecalciferol 1000 units tablet Commonly known as: VITAMIN D Take 1,000 Units by mouth daily.   fluticasone 50 MCG/ACT nasal spray Commonly known as: FLONASE Place 2 sprays into both nostrils daily.   ipratropium 0.06 % nasal spray Commonly known as: Atrovent Place 2 sprays into both nostrils 4 (four) times daily.   lisinopril 10 MG tablet Commonly known as: ZESTRIL Take 10 mg by mouth daily.   multivitamin tablet Take 1 tablet by mouth daily.   ondansetron 8 MG disintegrating tablet Commonly known as: ZOFRAN-ODT Take 1 tablet (8 mg total) by mouth every 8 (eight) hours as needed for nausea or  vomiting.   promethazine 12.5 MG tablet Commonly known as: PHENERGAN Take 1 tablet (12.5 mg total) by mouth every 8 (eight) hours as needed for nausea or vomiting.   Synthroid 100 MCG tablet Generic drug: levothyroxine TAKE 1 TABLET BEFORE BREAKFAST DAILY EXCEPT HALF ON SUNDAYS. **MUST BE SEEN FOR ADDITIONAL REFILLS.**       Allergies:  Allergies  Allergen Reactions  . Sulfa Antibiotics Nausea And Vomiting  . Codeine     Violently ill    Past Medical History:  Diagnosis Date  . Anxiety   . Blood transfusion without reported diagnosis   . Hypertension     Past Surgical History:  Procedure  Laterality Date  . ABDOMINAL HYSTERECTOMY    . BREAST LUMPECTOMY Right 1998   benign lump removed   . BREAST SURGERY    . REDUCTION MAMMAPLASTY Bilateral   . TOTAL THYROIDECTOMY  2011    Family History  Problem Relation Age of Onset  . Hypertension Mother   . Diverticulitis Mother   . Cancer Maternal Grandmother   . Cancer Maternal Grandfather   . Cancer Paternal Grandmother   . Cancer Paternal Grandfather   . Stroke Father 18  . Alcohol abuse Brother        in recovery    Social History:  reports that she has never smoked. She has never used smokeless tobacco. She reports that she does not drink alcohol and does not use drugs.  REVIEW Of SYSTEMS:   She has a history of hypertension followed by her gynecologist and treated with lisinopril 10 mg  BP Readings from Last 3 Encounters:  07/16/19 129/79  10/27/18 121/71  09/09/18 121/81    She does not have a PCP  She was concerned about vitamin D deficiency but this is normal  She feels a crunching sound when she is walking on her knees   Examination:   There were no vitals taken for this visit.     Assessment/Plan:    Hypothyroidism, postsurgical   She has not been seen in follow-up for over a year again  She is complaining of weight gain and increasing fatigue However her thyroid levels have been consistently normal including when she had called in last November  Currently taking 6-1/2 tablets a week of 100 mcg brand-name Synthroid and is very regular with this in the mornings before eating  TSH is fluctuating slightly but still fairly good at 3.0  Since she is having fatigue she can try taking 7 pills a week now However recommended that she start working with her PCP for her symptoms of fatigue  She will be given information on Synthroid direct program to help for the cost of the brand-name medication  There are no Patient Instructions on file for this visit.   [name] 03/02/2020, 6:14 PM

## 2020-03-03 ENCOUNTER — Telehealth (INDEPENDENT_AMBULATORY_CARE_PROVIDER_SITE_OTHER): Payer: BC Managed Care – PPO | Admitting: Endocrinology

## 2020-03-03 ENCOUNTER — Other Ambulatory Visit: Payer: Self-pay

## 2020-03-03 DIAGNOSIS — E890 Postprocedural hypothyroidism: Secondary | ICD-10-CM | POA: Diagnosis not present

## 2020-03-04 ENCOUNTER — Other Ambulatory Visit: Payer: Self-pay | Admitting: Endocrinology

## 2020-03-04 ENCOUNTER — Other Ambulatory Visit: Payer: Self-pay

## 2020-03-11 ENCOUNTER — Telehealth: Payer: BC Managed Care – PPO | Admitting: Endocrinology

## 2020-04-09 ENCOUNTER — Telehealth: Payer: Self-pay | Admitting: *Deleted

## 2020-04-09 DIAGNOSIS — E890 Postprocedural hypothyroidism: Secondary | ICD-10-CM

## 2020-04-09 MED ORDER — SYNTHROID 100 MCG PO TABS: ORAL_TABLET | ORAL | 1 refills | Status: DC

## 2020-04-09 NOTE — Telephone Encounter (Signed)
RX refilled  

## 2020-05-04 ENCOUNTER — Telehealth: Payer: Self-pay | Admitting: Endocrinology

## 2020-05-04 NOTE — Telephone Encounter (Signed)
[facility] is sending a request to change Synthroid to Unithroid but the request is in her husband's name.  Please determine with patient whether this is the correct information.  She will need to let us know if Synthroid is not covered and we can switch her to the same dose of Unithroid 100 mcg, 6-1/2 tablets a week.  Also she needs a follow-up in 3 months with labs if she is going to switch the brand

## 2020-05-05 NOTE — Telephone Encounter (Signed)
Left message for patient to call office to discuss medication needs.

## 2020-05-05 NOTE — Telephone Encounter (Signed)
Patient asked if we could call her back in about 15 min.

## 2020-05-05 NOTE — Telephone Encounter (Signed)
Left message for patient to call back to clarify thyroid medication need.

## 2020-05-06 NOTE — Telephone Encounter (Signed)
Left message for patient to call back regarding her medication need.  Advised her to have pharmacy send request or call back.

## 2020-06-23 ENCOUNTER — Telehealth: Payer: Self-pay | Admitting: Family Medicine

## 2020-06-23 NOTE — Telephone Encounter (Signed)
I called pt to ask her how she would like me to get her medical records to her or if she would like to pick them up. I left her a vm to cb.

## 2020-08-19 ENCOUNTER — Other Ambulatory Visit: Payer: Self-pay | Admitting: Obstetrics and Gynecology

## 2020-08-19 DIAGNOSIS — Z1231 Encounter for screening mammogram for malignant neoplasm of breast: Secondary | ICD-10-CM

## 2020-08-20 ENCOUNTER — Other Ambulatory Visit: Payer: Self-pay

## 2020-08-20 ENCOUNTER — Telehealth (INDEPENDENT_AMBULATORY_CARE_PROVIDER_SITE_OTHER): Payer: BC Managed Care – PPO | Admitting: Family Medicine

## 2020-08-20 ENCOUNTER — Encounter: Payer: Self-pay | Admitting: Family Medicine

## 2020-08-20 DIAGNOSIS — R059 Cough, unspecified: Secondary | ICD-10-CM | POA: Diagnosis not present

## 2020-08-20 DIAGNOSIS — R0981 Nasal congestion: Secondary | ICD-10-CM | POA: Diagnosis not present

## 2020-08-20 DIAGNOSIS — J029 Acute pharyngitis, unspecified: Secondary | ICD-10-CM

## 2020-08-20 LAB — POCT RAPID STREP A (OFFICE): Rapid Strep A Screen: NEGATIVE

## 2020-08-20 NOTE — Addendum Note (Signed)
Addended by: Tobie Poet on: 08/20/2020 05:16 PM   Modules accepted: Orders

## 2020-08-20 NOTE — Addendum Note (Signed)
Addended by: Argentina Ponder on: 08/20/2020 12:02 PM   Modules accepted: Orders

## 2020-08-20 NOTE — Patient Instructions (Signed)
COVID-19 COVID-19 is a respiratory infection that is caused by a virus called severe acute respiratory syndrome coronavirus 2 (SARS-CoV-2). The disease is also known as coronavirus disease or novel coronavirus. In some people, the virus may not cause any symptoms. In others, it may cause a serious infection. The infection can get worse quickly and can lead to complications, such as:  Pneumonia, or infection of the lungs.  Acute respiratory distress syndrome or ARDS. This is a condition in which fluid build-up in the lungs prevents the lungs from filling with air and passing oxygen into the blood.  Acute respiratory failure. This is a condition in which there is not enough oxygen passing from the lungs to the body or when carbon dioxide is not passing from the lungs out of the body.  Sepsis or septic shock. This is a serious bodily reaction to an infection.  Blood clotting problems.  Secondary infections due to bacteria or fungus.  Organ failure. This is when your body's organs stop working. The virus that causes COVID-19 is contagious. This means that it can spread from person to person through droplets from coughs and sneezes (respiratory secretions). What are the causes? This illness is caused by a virus. You may catch the virus by:  Breathing in droplets from an infected person. Droplets can be spread by a person breathing, speaking, singing, coughing, or sneezing.  Touching something, like a table or a doorknob, that was exposed to the virus (contaminated) and then touching your mouth, nose, or eyes. What increases the risk? Risk for infection You are more likely to be infected with this virus if you:  Are within 6 feet (2 meters) of a person with COVID-19.  Provide care for or live with a person who is infected with COVID-19.  Spend time in crowded indoor spaces or live in shared housing. Risk for serious illness You are more likely to become seriously ill from the virus if you:   Are 50 years of age or older. The higher your age, the more you are at risk for serious illness.  Live in a nursing home or long-term care facility.  Have cancer.  Have a long-term (chronic) disease such as: ? Chronic lung disease, including chronic obstructive pulmonary disease or asthma. ? A long-term disease that lowers your body's ability to fight infection (immunocompromised). ? Heart disease, including heart failure, a condition in which the arteries that lead to the heart become narrow or blocked (coronary artery disease), a disease which makes the heart muscle thick, weak, or stiff (cardiomyopathy). ? Diabetes. ? Chronic kidney disease. ? Sickle cell disease, a condition in which red blood cells have an abnormal "sickle" shape. ? Liver disease.  Are obese. What are the signs or symptoms? Symptoms of this condition can range from mild to severe. Symptoms may appear any time from 2 to 14 days after being exposed to the virus. They include:  A fever or chills.  A cough.  Difficulty breathing.  Headaches, body aches, or muscle aches.  Runny or stuffy (congested) nose.  A sore throat.  New loss of taste or smell. Some people may also have stomach problems, such as nausea, vomiting, or diarrhea. Other people may not have any symptoms of COVID-19. How is this diagnosed? This condition may be diagnosed based on:  Your signs and symptoms, especially if: ? You live in an area with a COVID-19 outbreak. ? You recently traveled to or from an area where the virus is common. ? You   provide care for or live with a person who was diagnosed with COVID-19. ? You were exposed to a person who was diagnosed with COVID-19.  A physical exam.  Lab tests, which may include: ? Taking a sample of fluid from the back of your nose and throat (nasopharyngeal fluid), your nose, or your throat using a swab. ? A sample of mucus from your lungs (sputum). ? Blood tests.  Imaging tests, which  may include, X-rays, CT scan, or ultrasound. How is this treated? At present, there is no medicine to treat COVID-19. Medicines that treat other diseases are being used on a trial basis to see if they are effective against COVID-19. Your health care provider will talk with you about ways to treat your symptoms. For most people, the infection is mild and can be managed at home with rest, fluids, and over-the-counter medicines. Treatment for a serious infection usually takes places in a hospital intensive care unit (ICU). It may include one or more of the following treatments. These treatments are given until your symptoms improve.  Receiving fluids and medicines through an IV.  Supplemental oxygen. Extra oxygen is given through a tube in the nose, a face mask, or a hood.  Positioning you to lie on your stomach (prone position). This makes it easier for oxygen to get into the lungs.  Continuous positive airway pressure (CPAP) or bi-level positive airway pressure (BPAP) machine. This treatment uses mild air pressure to keep the airways open. A tube that is connected to a motor delivers oxygen to the body.  Ventilator. This treatment moves air into and out of the lungs by using a tube that is placed in your windpipe.  Tracheostomy. This is a procedure to create a hole in the neck so that a breathing tube can be inserted.  Extracorporeal membrane oxygenation (ECMO). This procedure gives the lungs a chance to recover by taking over the functions of the heart and lungs. It supplies oxygen to the body and removes carbon dioxide. Follow these instructions at home: Lifestyle  If you are sick, stay home except to get medical care. Your health care provider will tell you how long to stay home. Call your health care provider before you go for medical care.  Rest at home as told by your health care provider.  Do not use any products that contain nicotine or tobacco, such as cigarettes, e-cigarettes, and  chewing tobacco. If you need help quitting, ask your health care provider.  Return to your normal activities as told by your health care provider. Ask your health care provider what activities are safe for you. General instructions  Take over-the-counter and prescription medicines only as told by your health care provider.  Drink enough fluid to keep your urine pale yellow.  Keep all follow-up visits as told by your health care provider. This is important. How is this prevented?  There is no vaccine to help prevent COVID-19 infection. However, there are steps you can take to protect yourself and others from this virus. To protect yourself:   Do not travel to areas where COVID-19 is a risk. The areas where COVID-19 is reported change often. To identify high-risk areas and travel restrictions, check the CDC travel website: wwwnc.cdc.gov/travel/notices  If you live in, or must travel to, an area where COVID-19 is a risk, take precautions to avoid infection. ? Stay away from people who are sick. ? Wash your hands often with soap and water for 20 seconds. If soap and water   are not available, use an alcohol-based hand sanitizer. ? Avoid touching your mouth, face, eyes, or nose. ? Avoid going out in public, follow guidance from your state and local health authorities. ? If you must go out in public, wear a cloth face covering or face mask. Make sure your mask covers your nose and mouth. ? Avoid crowded indoor spaces. Stay at least 6 feet (2 meters) away from others. ? Disinfect objects and surfaces that are frequently touched every day. This may include:  Counters and tables.  Doorknobs and light switches.  Sinks and faucets.  Electronics, such as phones, remote controls, keyboards, computers, and tablets. To protect others: If you have symptoms of COVID-19, take steps to prevent the virus from spreading to others.  If you think you have a COVID-19 infection, contact your health care  provider right away. Tell your health care team that you think you may have a COVID-19 infection.  Stay home. Leave your house only to seek medical care. Do not use public transport.  Do not travel while you are sick.  Wash your hands often with soap and water for 20 seconds. If soap and water are not available, use alcohol-based hand sanitizer.  Stay away from other members of your household. Let healthy household members care for children and pets, if possible. If you have to care for children or pets, wash your hands often and wear a mask. If possible, stay in your own room, separate from others. Use a different bathroom.  Make sure that all people in your household wash their hands well and often.  Cough or sneeze into a tissue or your sleeve or elbow. Do not cough or sneeze into your hand or into the air.  Wear a cloth face covering or face mask. Make sure your mask covers your nose and mouth. Where to find more information  Centers for Disease Control and Prevention: www.cdc.gov/coronavirus/2019-ncov/index.html  World Health Organization: www.who.int/health-topics/coronavirus Contact a health care provider if:  You live in or have traveled to an area where COVID-19 is a risk and you have symptoms of the infection.  You have had contact with someone who has COVID-19 and you have symptoms of the infection. Get help right away if:  You have trouble breathing.  You have pain or pressure in your chest.  You have confusion.  You have bluish lips and fingernails.  You have difficulty waking from sleep.  You have symptoms that get worse. These symptoms may represent a serious problem that is an emergency. Do not wait to see if the symptoms will go away. Get medical help right away. Call your local emergency services (911 in the [place] Do not drive yourself to the hospital. Let the emergency medical personnel know if you think you have COVID-19. Summary  COVID-19 is a  respiratory infection that is caused by a virus. It is also known as coronavirus disease or novel coronavirus. It can cause serious infections, such as pneumonia, acute respiratory distress syndrome, acute respiratory failure, or sepsis.  The virus that causes COVID-19 is contagious. This means that it can spread from person to person through droplets from breathing, speaking, singing, coughing, or sneezing.  You are more likely to develop a serious illness if you are 50 years of age or older, have a weak immune system, live in a nursing home, or have chronic disease.  There is no medicine to treat COVID-19. Your health care provider will talk with you about ways to treat your symptoms.    Take steps to protect yourself and others from infection. Wash your hands often and disinfect objects and surfaces that are frequently touched every day. Stay away from people who are sick and wear a mask if you are sick. This information is not intended to replace advice given to you by your health care provider. Make sure you discuss any questions you have with your health care provider. Document Revised: 06/01/2019 Document Reviewed: 09/07/2018 Elsevier Patient Education  2020 Elsevier Inc.  

## 2020-08-20 NOTE — Progress Notes (Addendum)
Virtual Visit Note  I connected with patient on 08/20/20 at 1137 by telephone due to unable to work Epic video visit and verified that I am speaking with the correct person using two identifiers. Olivia Page is currently located at home and no family members are currently with them during visit. The provider, Azalee Course Lamaria Hildebrandt, FNP is located in their office at time of visit.  I discussed the limitations, risks, security and privacy concerns of performing an evaluation and management service by telephone and the availability of in person appointments. I also discussed with the patient that there may be a patient responsible charge related to this service. The patient expressed understanding and agreed to proceed.   I provided 20 minutes of non-face-to-face time during this encounter.  Chief Complaint  Patient presents with  . Sore Throat    Fevers , chills, body aches, headache, cough, congestion- taking ibuprofen - daughter pos strep and covid 12/31/ 22    HPI ? Fever, chills, headache, myalgia, congestion Daughter tested positive for strep on covid on 31st Her symptoms started day or 2 after Taking Ibuprofen for her symptoms Works temporarily Max fever 101.5 Not vaccinated against covid Had covid last year    Allergies  Allergen Reactions  . Sulfa Antibiotics Nausea And Vomiting  . Codeine     Violently ill    Prior to Admission medications   Medication Sig Start Date End Date Taking? Authorizing Provider  ALPRAZolam Prudy Feeler) 0.5 MG tablet Take 0.5 mg by mouth at bedtime as needed.   Yes [provider]  lisinopril (PRINIVIL,ZESTRIL) 10 MG tablet Take 10 mg by mouth daily. 08/13/14  Yes [provider]  Multiple Vitamin (MULTIVITAMIN) tablet Take 1 tablet by mouth daily.   Yes [provider]  promethazine (PHENERGAN) 12.5 MG suppository Place 12.5 mg rectally every 6 (six) hours as needed for nausea or vomiting.   Yes [provider]   SYNTHROID 100 MCG tablet Take 1 tablet by mouth once daily before breakfast. 04/09/20  Yes [name], MD  cholecalciferol (VITAMIN D) 1000 units tablet Take 1,000 Units by mouth daily. Patient not taking: Reported on 08/20/2020    [provider]  fluticasone (FLONASE) 50 MCG/ACT nasal spray Place 2 sprays into both nostrils daily. Patient not taking: Reported on 08/20/2020 10/27/18   Belinda Fisher, PA-C  ipratropium (ATROVENT) 0.06 % nasal spray Place 2 sprays into both nostrils 4 (four) times daily. Patient not taking: Reported on 08/20/2020 10/27/18   Lurline Idol    Past Medical History:  Diagnosis Date  . Anxiety   . Blood transfusion without reported diagnosis   . Hypertension     Past Surgical History:  Procedure Laterality Date  . ABDOMINAL HYSTERECTOMY    . BREAST LUMPECTOMY Right 1998   benign lump removed   . BREAST SURGERY    . REDUCTION MAMMAPLASTY Bilateral   . TOTAL THYROIDECTOMY  2011    Social History   Tobacco Use  . Smoking status: Never Smoker  . Smokeless tobacco: Never Used  Substance Use Topics  . Alcohol use: No    Alcohol/week: 0.0 standard drinks    Family History  Problem Relation Age of Onset  . Hypertension Mother   . Diverticulitis Mother   . Cancer Maternal Grandmother   . Cancer Maternal Grandfather   . Cancer Paternal Grandmother   . Cancer Paternal Grandfather   . Stroke Father 64  . Alcohol abuse Brother  in recovery    Review of Systems  Constitutional: Positive for chills, fever and malaise/fatigue.  HENT: Positive for congestion and sore throat. Negative for ear discharge, ear pain and sinus pain.   Eyes: Negative for pain, discharge and redness.  Respiratory: Negative for cough, sputum production and shortness of breath.   Cardiovascular: Negative for chest pain and palpitations.  Gastrointestinal: Negative for abdominal pain, constipation, diarrhea, nausea and vomiting.  Musculoskeletal: Positive for myalgias.   Neurological: Positive for headaches.    Objective  Constitutional:      General: She is not in acute distress.    Appearance: Normal appearance. She is not ill-appearing.   Pulmonary:     Effort: Pulmonary effort is normal. No respiratory distress.  Neurological:     Mental Status: She is alert and oriented to person, place, and time.  Psychiatric:        Mood and Affect: Mood normal.        Behavior: Behavior normal.     ASSESSMENT and PLAN  Problem List Items Addressed This Visit   None   Visit Diagnoses    Acute pharyngitis, unspecified etiology    -  Primary   Relevant Orders   POCT rapid strep A (Completed)   Culture, Group A Strep   Congestion of nasal sinus       Relevant Orders   COVID-19, Flu A+B and RSV     Plan  Will follow up with lab results  Continue OTC conservative treatments  Discussed progression and answered all questions  RTC/ ED precautions provided  Return if symptoms worsen or fail to improve.    The above assessment and management plan was discussed with the patient. The patient verbalized understanding of and has agreed to the management plan. Patient is aware to call the clinic if symptoms persist or worsen. Patient is aware when to return to the clinic for a follow-up visit. Patient educated on when it is appropriate to go to the emergency department.     Macario Carls Jyssica Rief, FNP-BC [facility] [address] [place], [place] [address] Ph.  [phone] Fax [phone]  I have reviewed and agree with above documentation. Edwina Barth, MD

## 2020-08-21 ENCOUNTER — Telehealth: Payer: Self-pay | Admitting: Family Medicine

## 2020-08-21 NOTE — Telephone Encounter (Signed)
Patient called back again wanting results

## 2020-08-21 NOTE — Telephone Encounter (Signed)
Patient was in yesterday for Covid and Strep / her husband has his results patient does does have access ro portal /patient is asking [place] for call with results are in.

## 2020-08-21 NOTE — Telephone Encounter (Signed)
Informed patient negative poc strep results , other tests are still pending .

## 2020-08-22 LAB — CULTURE, GROUP A STREP: Strep A Culture: NEGATIVE

## 2020-08-23 LAB — COVID-19, FLU A+B AND RSV
Influenza A, NAA: NOT DETECTED
Influenza B, NAA: NOT DETECTED
RSV, NAA: NOT DETECTED
SARS-CoV-2, NAA: DETECTED — AB

## 2020-08-25 NOTE — Progress Notes (Signed)
If you could let her know her covid is positive.

## 2020-08-28 NOTE — Telephone Encounter (Signed)
Pt called wanting a letting for her to go back to work on Monday 09/01/20. She is still having headaches and having no energy. Please advise.

## 2020-08-28 NOTE — Telephone Encounter (Signed)
Sent via MyChart

## 2020-09-05 ENCOUNTER — Telehealth: Payer: Self-pay | Admitting: Family Medicine

## 2020-09-05 NOTE — Telephone Encounter (Signed)
Pt is wanting a letter via her mychart. She would like it to state that she should  only work 6hrs a day verses the 8hrs a day she is working now. Please advise pt at [phone].

## 2020-09-05 NOTE — Telephone Encounter (Signed)
Pt wants letter stating she can only work 6 hours not 8? Do not see any previous assessment for this should I call her for appt?

## 2020-09-07 NOTE — Telephone Encounter (Signed)
I saw her for a sick visit, but am not sure why she would need a shorter work day. You can ask her for more information.

## 2020-09-08 NOTE — Telephone Encounter (Signed)
Called pt discussed she wants reduced schedule letter 6 no more than 7 hours in one shift as she is too tired to do 8 right now and wants this to be 1 month post covid accomodation. Please advise I did ask her to check with her employer if theres forms that need to be filled out for this. Please advise if this is acceptable

## 2020-09-08 NOTE — Telephone Encounter (Signed)
Her symptoms started on 1/2 and tested positive on 1/5. So I would write it for max 7 hour shifts until 09/17/20. Thanks!

## 2020-09-08 NOTE — Telephone Encounter (Signed)
Called and informed her available via MyChart.

## 2020-09-19 ENCOUNTER — Other Ambulatory Visit: Payer: Self-pay | Admitting: Endocrinology

## 2020-09-19 DIAGNOSIS — E890 Postprocedural hypothyroidism: Secondary | ICD-10-CM

## 2020-09-26 ENCOUNTER — Other Ambulatory Visit: Payer: Self-pay

## 2020-09-26 ENCOUNTER — Ambulatory Visit
Admission: RE | Admit: 2020-09-26 | Discharge: 2020-09-26 | Disposition: A | Discharge status: Home / Self Care | Payer: BC Managed Care – PPO | Source: Ambulatory Visit | Attending: Obstetrics and Gynecology | Admitting: Obstetrics and Gynecology

## 2020-09-26 DIAGNOSIS — Z1231 Encounter for screening mammogram for malignant neoplasm of breast: Secondary | ICD-10-CM

## 2020-10-14 DIAGNOSIS — M8580 Other specified disorders of bone density and structure, unspecified site: Secondary | ICD-10-CM | POA: Diagnosis not present

## 2020-10-14 DIAGNOSIS — Z6827 Body mass index (BMI) 27.0-27.9, adult: Secondary | ICD-10-CM | POA: Diagnosis not present

## 2020-10-14 DIAGNOSIS — L309 Dermatitis, unspecified: Secondary | ICD-10-CM | POA: Diagnosis not present

## 2020-10-14 DIAGNOSIS — Z13820 Encounter for screening for osteoporosis: Secondary | ICD-10-CM | POA: Diagnosis not present

## 2020-10-14 DIAGNOSIS — Z01419 Encounter for gynecological examination (general) (routine) without abnormal findings: Secondary | ICD-10-CM | POA: Diagnosis not present

## 2020-10-23 DIAGNOSIS — Z130 Encounter for screening for diseases of the blood and blood-forming organs and certain disorders involving the immune mechanism: Secondary | ICD-10-CM | POA: Diagnosis not present

## 2020-10-23 DIAGNOSIS — Z13228 Encounter for screening for other metabolic disorders: Secondary | ICD-10-CM | POA: Diagnosis not present

## 2020-10-23 DIAGNOSIS — Z131 Encounter for screening for diabetes mellitus: Secondary | ICD-10-CM | POA: Diagnosis not present

## 2020-10-23 DIAGNOSIS — Z13220 Encounter for screening for lipoid disorders: Secondary | ICD-10-CM | POA: Diagnosis not present

## 2020-12-10 DIAGNOSIS — M25562 Pain in left knee: Secondary | ICD-10-CM | POA: Diagnosis not present

## 2021-01-02 DIAGNOSIS — M25562 Pain in left knee: Secondary | ICD-10-CM | POA: Diagnosis not present

## 2021-01-08 DIAGNOSIS — M25562 Pain in left knee: Secondary | ICD-10-CM | POA: Diagnosis not present

## 2021-01-29 DIAGNOSIS — M25562 Pain in left knee: Secondary | ICD-10-CM | POA: Diagnosis not present

## 2021-01-29 DIAGNOSIS — M2342 Loose body in knee, left knee: Secondary | ICD-10-CM | POA: Diagnosis not present

## 2021-02-11 DIAGNOSIS — M2342 Loose body in knee, left knee: Secondary | ICD-10-CM | POA: Diagnosis not present

## 2021-02-11 DIAGNOSIS — M94262 Chondromalacia, left knee: Secondary | ICD-10-CM | POA: Diagnosis not present

## 2021-02-11 DIAGNOSIS — M948X6 Other specified disorders of cartilage, lower leg: Secondary | ICD-10-CM | POA: Diagnosis not present

## 2021-02-11 DIAGNOSIS — M65862 Other synovitis and tenosynovitis, left lower leg: Secondary | ICD-10-CM | POA: Diagnosis not present

## 2021-02-11 DIAGNOSIS — M2341 Loose body in knee, right knee: Secondary | ICD-10-CM | POA: Diagnosis not present

## 2021-02-11 DIAGNOSIS — S8332XA Tear of articular cartilage of left knee, current, initial encounter: Secondary | ICD-10-CM | POA: Diagnosis not present

## 2021-02-11 DIAGNOSIS — G8918 Other acute postprocedural pain: Secondary | ICD-10-CM | POA: Diagnosis not present

## 2021-02-11 DIAGNOSIS — M2242 Chondromalacia patellae, left knee: Secondary | ICD-10-CM | POA: Diagnosis not present

## 2021-02-11 HISTORY — PX: KNEE SURGERY: SHX244

## 2021-02-23 DIAGNOSIS — M25562 Pain in left knee: Secondary | ICD-10-CM | POA: Diagnosis not present

## 2021-02-26 DIAGNOSIS — M25562 Pain in left knee: Secondary | ICD-10-CM | POA: Diagnosis not present

## 2021-03-05 DIAGNOSIS — M25562 Pain in left knee: Secondary | ICD-10-CM | POA: Diagnosis not present

## 2021-03-12 ENCOUNTER — Telehealth: Payer: Self-pay | Admitting: Endocrinology

## 2021-03-12 DIAGNOSIS — M25562 Pain in left knee: Secondary | ICD-10-CM | POA: Diagnosis not present

## 2021-03-12 DIAGNOSIS — E890 Postprocedural hypothyroidism: Secondary | ICD-10-CM

## 2021-03-12 NOTE — Telephone Encounter (Signed)
Called patient to let her know that no messages were sent to her from the office. Patient does want to know when you would like to see her again.

## 2021-03-12 NOTE — Telephone Encounter (Signed)
Pt called stating she got a text message to check her portal and states it said it was regarding something with Dr.Kumar, pt states at this time she is unable to open her portal so she requests whatever that was relayed in her in MyChart just be left as a message on her phone. I did inform pt that I did not see a message that was sent or a medication being refilled but pt was addiment she had a message from Dr.  [phone]

## 2021-03-13 NOTE — Telephone Encounter (Signed)
LMTCB to schedule lab appt and Dr appt

## 2021-03-16 DIAGNOSIS — M25562 Pain in left knee: Secondary | ICD-10-CM | POA: Diagnosis not present

## 2021-03-20 DIAGNOSIS — M25562 Pain in left knee: Secondary | ICD-10-CM | POA: Diagnosis not present

## 2021-03-26 DIAGNOSIS — M25562 Pain in left knee: Secondary | ICD-10-CM | POA: Diagnosis not present

## 2021-03-27 MED ORDER — LEVOTHYROXINE SODIUM 75 MCG PO TABS: ORAL_TABLET | ORAL | 0 refills | Status: DC

## 2021-03-27 MED ORDER — LEVOTHYROXINE SODIUM 25 MCG PO TABS: ORAL_TABLET | ORAL | 1 refills | Status: DC

## 2021-03-27 NOTE — Telephone Encounter (Signed)
Received a fax for refill for synthroid for patient. I tried to call patient again to schedule follow up appointmet. Left vm to call office back to schedule appointment. 30 day supply of synthroid sent for patient. No further refills until Appointment is scheduled. Patient insurance no longer covers synthroid so levothyroxine and was sent to total the Synthroid patient was currently taking.

## 2021-03-30 DIAGNOSIS — M25562 Pain in left knee: Secondary | ICD-10-CM | POA: Diagnosis not present

## 2021-03-30 NOTE — Telephone Encounter (Signed)
Patient has a lot going on in life right now with having to take husband to get back surgery as well as having surgery herself , pt would like to do a virtual app and to be scheduled for a lab. But patient would like to get the virtual app approved by Dr.Kumar before scheduling her labs.   Pt would also  like to know if coming for  lab that pt needs to cut out vitamins a week prior to having her lab work so labs will be good.   Pt would like a call back regarding this information.

## 2021-04-01 NOTE — Telephone Encounter (Signed)
Routed to front staff to change appt . To virtual and lab.

## 2021-04-01 NOTE — Telephone Encounter (Signed)
Pt requesting virtual appt with Dr. Lucianne Muss, before making apt for the lab. Please advise.

## 2021-04-06 DIAGNOSIS — M25562 Pain in left knee: Secondary | ICD-10-CM | POA: Diagnosis not present

## 2021-04-08 ENCOUNTER — Other Ambulatory Visit: Payer: Self-pay | Admitting: Endocrinology

## 2021-04-16 ENCOUNTER — Other Ambulatory Visit: Payer: Self-pay

## 2021-04-16 ENCOUNTER — Telehealth: Payer: Self-pay | Admitting: Endocrinology

## 2021-04-16 DIAGNOSIS — E890 Postprocedural hypothyroidism: Secondary | ICD-10-CM

## 2021-04-16 MED ORDER — SYNTHROID 100 MCG PO TABS: ORAL_TABLET | ORAL | 0 refills | Status: DC

## 2021-04-16 NOTE — Telephone Encounter (Signed)
Olivia Page) DOB 2065/12/27 - is on the phone for the 2nd/3rd time of the morning - She is at the pharmacy - she was sent Levothyroxine and it should have been the name brand Synthroid .  She is aware that insurance does not cover Synthroid and she has always paid the full cost (for the last 2 years) - she is HIGHLY upset and ADAMANT that she needs to speak with a clinical staff member about how this happened and NEEDS the RX resent right now as she is recovering from knee surgery and husband just had major surgery. She is out of medication. - hung up before she could be given any assistance

## 2021-04-16 NOTE — Telephone Encounter (Signed)
I called patient. Informed her that brand name Synthroid has been sent in. I called pharmacy to confirm they had in stock and they do.

## 2021-04-17 ENCOUNTER — Other Ambulatory Visit: Payer: Self-pay | Admitting: Endocrinology

## 2021-04-17 DIAGNOSIS — E559 Vitamin D deficiency, unspecified: Secondary | ICD-10-CM

## 2021-04-17 DIAGNOSIS — E890 Postprocedural hypothyroidism: Secondary | ICD-10-CM

## 2021-04-21 ENCOUNTER — Other Ambulatory Visit: Payer: BC Managed Care – PPO

## 2021-04-27 ENCOUNTER — Other Ambulatory Visit (INDEPENDENT_AMBULATORY_CARE_PROVIDER_SITE_OTHER): Payer: BC Managed Care – PPO

## 2021-04-27 DIAGNOSIS — E559 Vitamin D deficiency, unspecified: Secondary | ICD-10-CM | POA: Diagnosis not present

## 2021-04-27 DIAGNOSIS — E890 Postprocedural hypothyroidism: Secondary | ICD-10-CM

## 2021-04-27 LAB — TSH: TSH: 0.93 u[IU]/mL (ref 0.35–5.50)

## 2021-04-27 LAB — T4, FREE: Free T4: 1.1 ng/dL (ref 0.60–1.60)

## 2021-04-27 LAB — VITAMIN D 25 HYDROXY (VIT D DEFICIENCY, FRACTURES): VITD: 38.21 ng/mL (ref 30.00–100.00)

## 2021-04-28 ENCOUNTER — Other Ambulatory Visit: Payer: Self-pay

## 2021-04-28 ENCOUNTER — Telehealth: Payer: BC Managed Care – PPO | Admitting: Endocrinology

## 2021-04-28 ENCOUNTER — Ambulatory Visit (INDEPENDENT_AMBULATORY_CARE_PROVIDER_SITE_OTHER): Payer: BC Managed Care – PPO | Admitting: Endocrinology

## 2021-04-28 ENCOUNTER — Encounter: Payer: Self-pay | Admitting: Endocrinology

## 2021-04-28 DIAGNOSIS — E890 Postprocedural hypothyroidism: Secondary | ICD-10-CM | POA: Diagnosis not present

## 2021-04-28 MED ORDER — SYNTHROID 100 MCG PO TABS: ORAL_TABLET | ORAL | 0 refills | Status: DC

## 2021-04-28 NOTE — Progress Notes (Signed)
Patient ID: Olivia Page, female   DOB: Jun 24, 1966, 55 y.o.   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   The patient has been treated with Synthroid or levothyroxine initially in variable doses  In 2019 she wanted to switch to the brand-name SYNTHROID  She is now taking her brand name Synthroid 100 mcg since 12/19 She previously was taking taking 6-1/2 pills a week but now is taking 1 tablet daily  This was changed in 2021 when she was having some nonspecific fatigue and TSH was relatively higher at 3.0  Over the last couple of years she has been gaining weight progressively More recently has had difficulty with knee pain Also dealing with menopausal symptoms      She is taking the Synthroid consistently in the morning around 30 minutes before eating. She is concerned about the cost of brand-name Synthroid She is not taking any vitamin supplements in the morning  Her TSH is very normal  Wt Readings from Last 3 Encounters:  04/28/21 159 lb 3.2 oz (72.2 kg)  09/09/18 140 lb (63.5 kg)  07/19/18 145 lb 12.8 oz (66.1 kg)    Lab Results  Component Value Date   TSH 0.93 04/27/2021   TSH 2.99 02/28/2020   TSH 0.60 06/29/2019   FREET4 1.10 04/27/2021   FREET4 1.13 02/28/2020   FREET4 1.23 06/29/2019     Allergies as of 04/28/2021       Reactions   Sulfa Antibiotics Nausea And Vomiting   Codeine    Violently ill        Medication List        Accurate as of April 28, 2021  9:06 AM. If you have any questions, ask your nurse or doctor.          ALPRAZolam 0.5 MG tablet Commonly known as: XANAX Take 0.5 mg by mouth at bedtime as needed.   cholecalciferol  1000 units tablet Commonly known as: VITAMIN D Take 1,000 Units by mouth daily.   fluticasone 50 MCG/ACT nasal spray Commonly known as: FLONASE Place 2 sprays into both nostrils daily.   ipratropium 0.06 % nasal spray Commonly known as: Atrovent Place 2 sprays into both nostrils 4 (four) times daily.   lisinopril 10 MG tablet Commonly known as: ZESTRIL Take 10 mg by mouth daily.   multivitamin tablet Take 1 tablet by mouth daily.   promethazine 12.5 MG suppository Commonly known as: PHENERGAN Place 12.5 mg rectally every 6 (six) hours as needed for nausea or vomiting.   Synthroid 100 MCG tablet Generic drug: levothyroxine Take 1 tablet Monday-Saturday and 1/2 tablet on Sunday.        Allergies:  Allergies  Allergen Reactions   Sulfa Antibiotics Nausea And Vomiting   Codeine     Violently ill    Past Medical History:  Diagnosis Date   Anxiety    Blood transfusion without reported diagnosis    Hypertension     Past Surgical History:  Procedure Laterality Date   ABDOMINAL HYSTERECTOMY     BREAST LUMPECTOMY Right 1998   benign lump removed    BREAST SURGERY     REDUCTION MAMMAPLASTY Bilateral  TOTAL THYROIDECTOMY  2011    Family History  Problem Relation Age of Onset   Hypertension Mother    Diverticulitis Mother    Cancer Maternal Grandmother    Cancer Maternal Grandfather    Cancer Paternal Grandmother    Cancer Paternal Grandfather    Stroke Father 27   Alcohol abuse Brother        in recovery    Social History:  reports that she has never smoked. She has never used smokeless tobacco. She reports that she does not drink alcohol and does not use drugs.  REVIEW Of SYSTEMS:   She has a history of hypertension followed by her gynecologist and treated with lisinopril 10 mg  BP Readings from Last 3 Encounters:  04/28/21 122/83  07/16/19 129/79  10/27/18 121/71    She was told to have osteopenia by her gynecologist Currently taking calcium  and vitamin D combination supplement for some time Vitamin D level again normal    Examination:   BP 122/83   Pulse 83   Ht 5' 4.5" (1.638 m)   Wt 159 lb 3.2 oz (72.2 kg)   SpO2 98%   BMI 26.90 kg/m      Assessment/Plan:    Hypothyroidism, postsurgical   Although she has nonspecific fatigue related to other issues she has fairly consistent thyroid levels on 100 mcg of brand-name Synthroid She has been on the same dose for about 3 years with consistent control Likely her weight gain is unrelated to her hypothyroidism  She is on daily 100 mcg brand-name Synthroid and is very regular with this TSH is improved with taking 7 tablets a week  TSH is 0.9 compared to 3.0 on the last visit  She will continue the same dose  She was given brochure and required information on Synthroid delivers program since currently the pharmacy is charging her $45 per month  Follow-up in 1 year  There are no Patient Instructions on file for this visit.   [name] 04/28/2021, 9:06 AM

## 2021-05-05 DIAGNOSIS — M25562 Pain in left knee: Secondary | ICD-10-CM | POA: Diagnosis not present

## 2021-05-05 DIAGNOSIS — M1712 Unilateral primary osteoarthritis, left knee: Secondary | ICD-10-CM | POA: Diagnosis not present

## 2021-05-05 DIAGNOSIS — Z4789 Encounter for other orthopedic aftercare: Secondary | ICD-10-CM | POA: Diagnosis not present

## 2021-05-08 IMAGING — MG MM DIGITAL SCREENING BILAT W/ TOMO AND CAD
8 series · 8 of 24 positions shown · non-contrast
Comparison: Previous exam(s).

CLINICAL DATA: Screening.

EXAM:
DIGITAL SCREENING BILATERAL MAMMOGRAM WITH TOMOSYNTHESIS AND CAD
TECHNIQUE: Bilateral screening digital craniocaudal and mediolateral oblique
mammograms were obtained. Bilateral screening digital breast
tomosynthesis was performed. The images were evaluated with
computer-aided detection.

[L CC synth-2D]
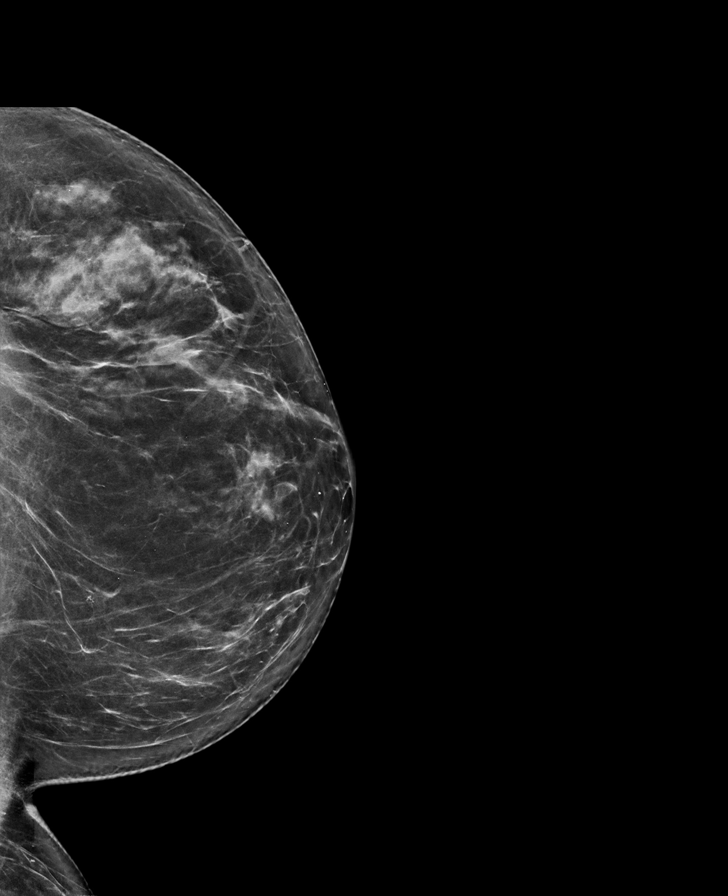

[R MLO synth-2D]
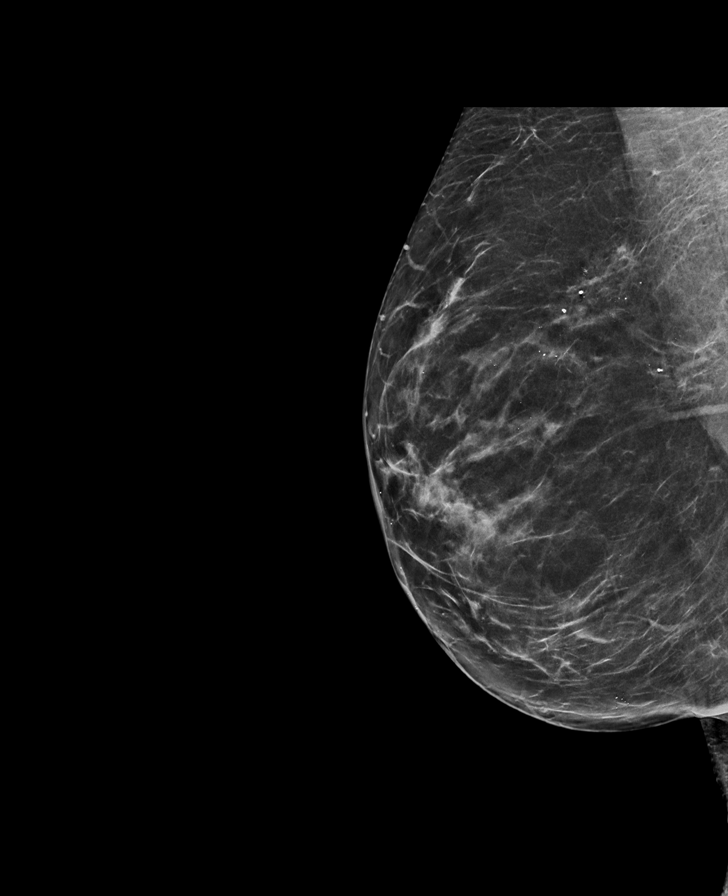

[L MLO synth-2D]
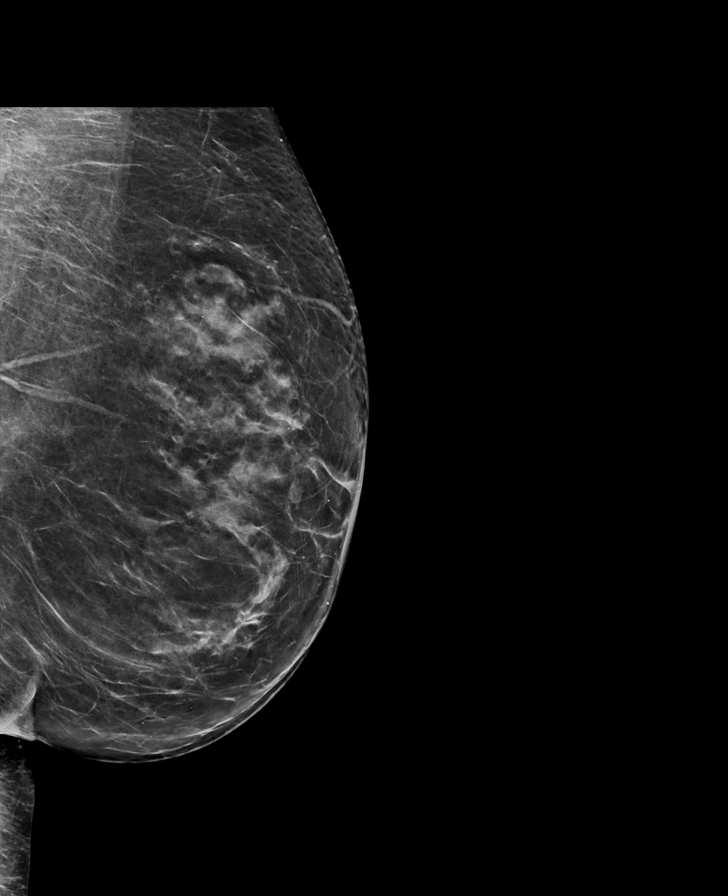

[R CC synth-2D]
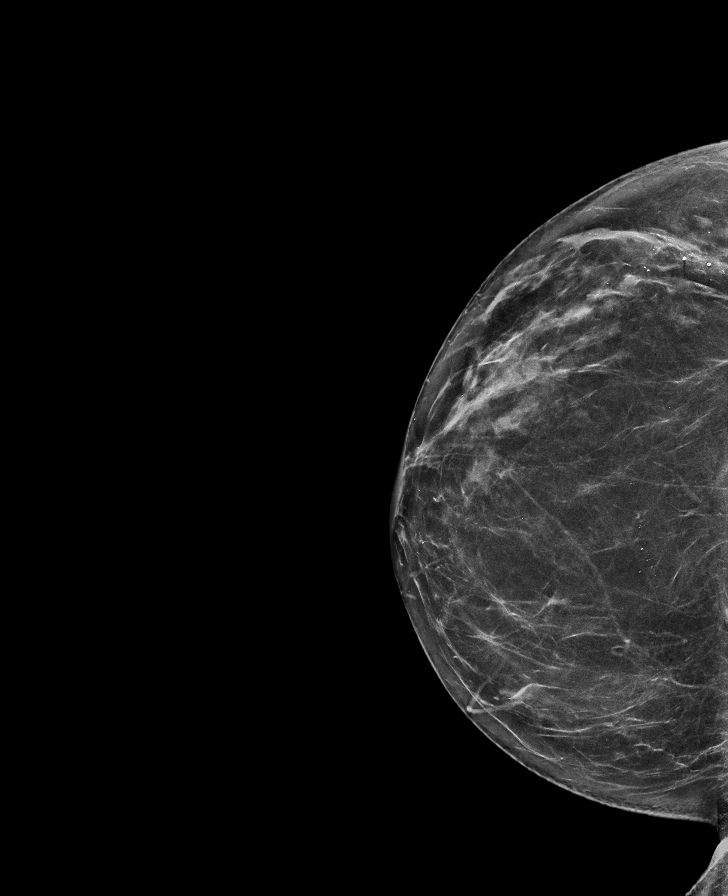

[R MLO tomo · tomo slice 37/72.0]
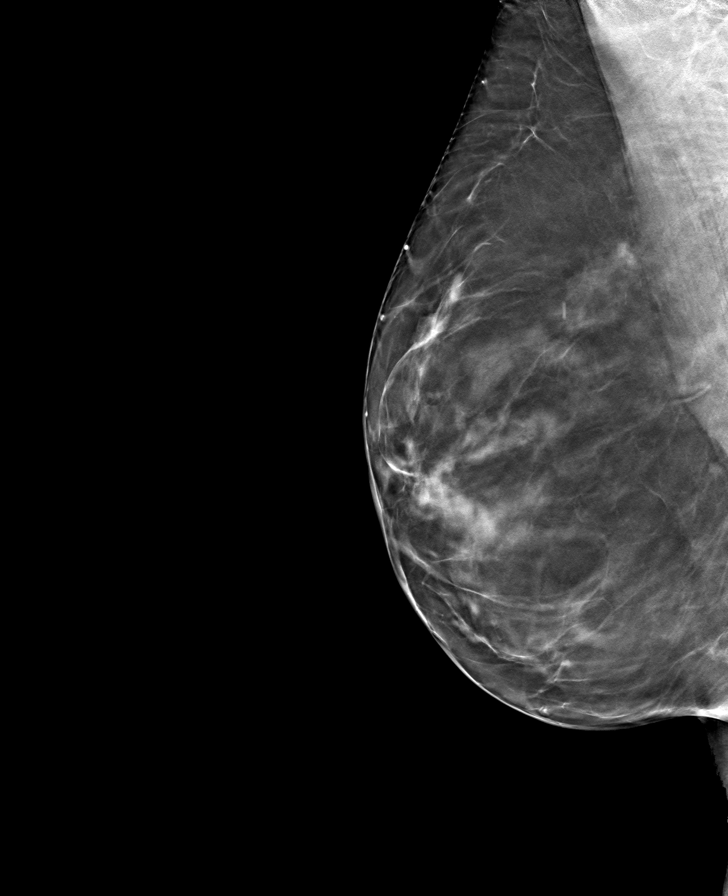

[R CC tomo · tomo slice 36/71.0]
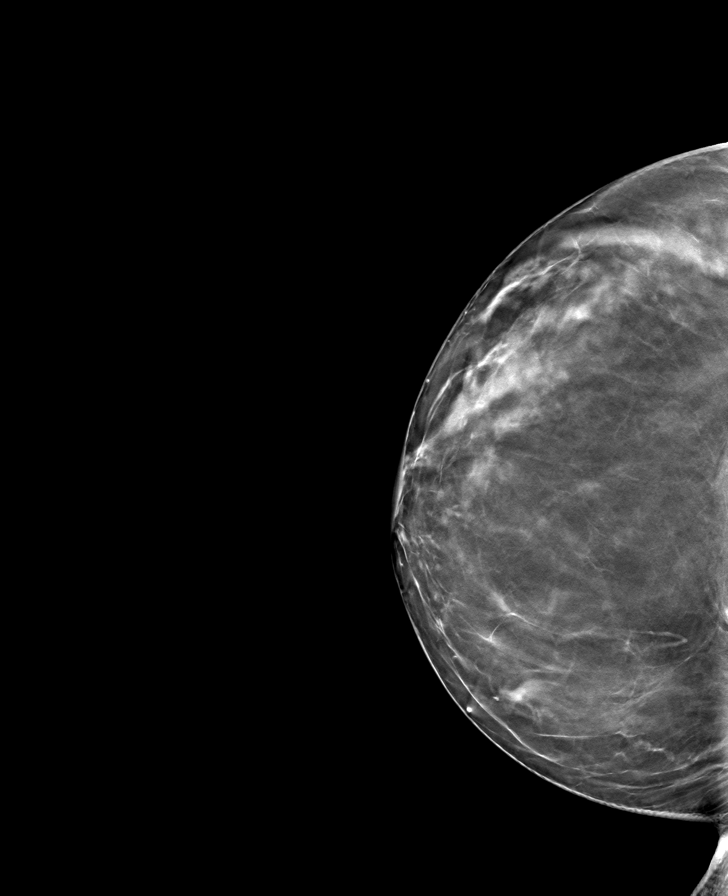

[L CC tomo · tomo slice 37/72.0]
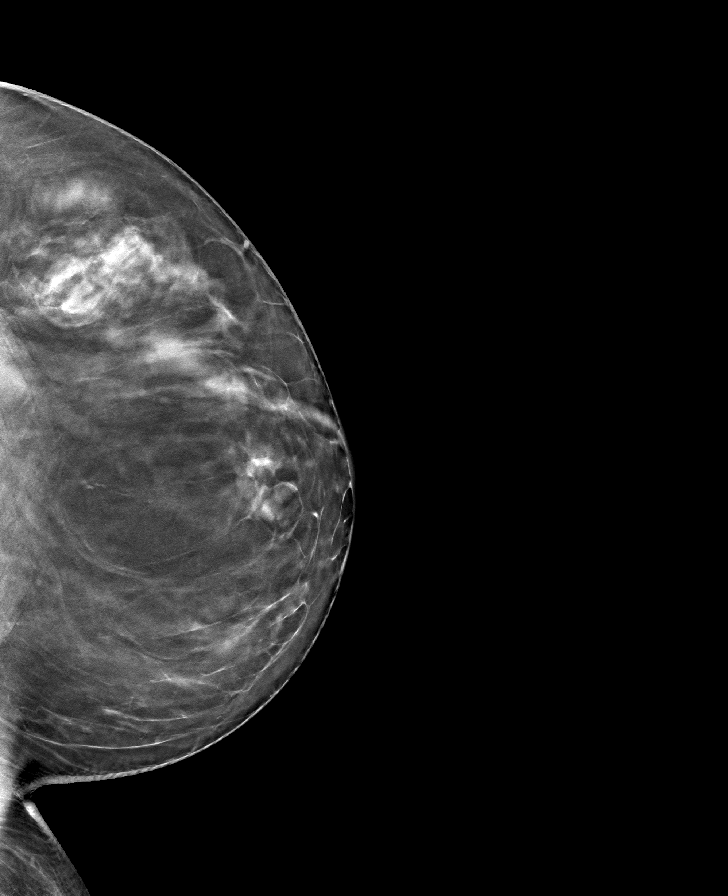

[L MLO tomo · tomo slice 37/73.0]
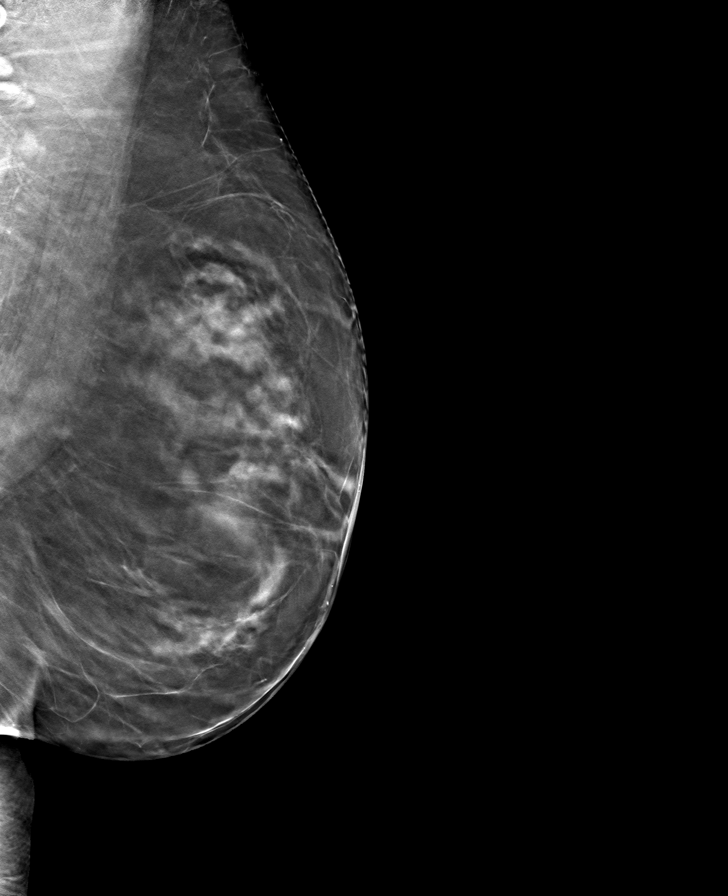

[8 of 24 positions shown; findings below may reference images not displayed]

ACR Breast Density Category b: There are scattered areas of
fibroglandular density.
FINDINGS: There are no findings suspicious for malignancy.
IMPRESSION: No mammographic evidence of malignancy. A result letter of this
screening mammogram will be mailed directly to the patient.

RECOMMENDATION:
Screening mammogram in one year. (Code:51-O-LD2)

BI-RADS CATEGORY  1: Negative.

## 2021-05-12 DIAGNOSIS — M1712 Unilateral primary osteoarthritis, left knee: Secondary | ICD-10-CM | POA: Diagnosis not present

## 2021-05-13 ENCOUNTER — Other Ambulatory Visit: Payer: Self-pay | Admitting: Endocrinology

## 2021-05-13 DIAGNOSIS — E890 Postprocedural hypothyroidism: Secondary | ICD-10-CM

## 2021-05-19 DIAGNOSIS — M1712 Unilateral primary osteoarthritis, left knee: Secondary | ICD-10-CM | POA: Diagnosis not present

## 2021-06-11 DIAGNOSIS — M1712 Unilateral primary osteoarthritis, left knee: Secondary | ICD-10-CM | POA: Diagnosis not present

## 2021-07-08 ENCOUNTER — Encounter: Payer: Self-pay | Admitting: Physician Assistant

## 2021-07-17 DIAGNOSIS — M1712 Unilateral primary osteoarthritis, left knee: Secondary | ICD-10-CM | POA: Diagnosis not present

## 2021-07-22 ENCOUNTER — Other Ambulatory Visit: Payer: Self-pay

## 2021-07-22 DIAGNOSIS — R87619 Unspecified abnormal cytological findings in specimens from cervix uteri: Secondary | ICD-10-CM | POA: Insufficient documentation

## 2021-07-24 ENCOUNTER — Encounter: Payer: Self-pay | Admitting: Nurse Practitioner

## 2021-07-24 ENCOUNTER — Ambulatory Visit (INDEPENDENT_AMBULATORY_CARE_PROVIDER_SITE_OTHER): Payer: BC Managed Care – PPO | Admitting: Nurse Practitioner

## 2021-07-24 ENCOUNTER — Encounter: Payer: Self-pay | Admitting: Gastroenterology

## 2021-07-24 VITALS — BP 116/70 | HR 76 | Ht 64.5 in | Wt 167.0 lb

## 2021-07-24 DIAGNOSIS — Z1211 Encounter for screening for malignant neoplasm of colon: Secondary | ICD-10-CM

## 2021-07-24 DIAGNOSIS — K219 Gastro-esophageal reflux disease without esophagitis: Secondary | ICD-10-CM

## 2021-07-24 NOTE — Patient Instructions (Signed)
PROCEDURES: You have been scheduled for an EGD and Colonoscopy. Please follow the written instructions given to you at your visit today. If you use inhalers (even only as needed), please bring them with you on the day of your procedure. We have provided you with a prep sample today.  OVER THE COUNTER MEDICATION Please purchase the following medications over the counter and take as directed:  Omeprazole 20 mg tablet, take once a day, may increase to twice a day if needed. Famotidine 20 MG tablet, take 1 at night as needed.  It was great seeing you today! Thank you for entrusting me with your care and choosing [facility].  Arnaldo Natal, CRNP  Gastroesophageal Reflux Disease, Adult Gastroesophageal reflux (GER) happens when acid from the stomach flows up into the tube that connects the mouth and the stomach (esophagus). Normally, food travels down the esophagus and stays in the stomach to be digested. With GER, food and stomach acid sometimes move back up into the esophagus. You may have a disease called gastroesophageal reflux disease (GERD) if the reflux: Happens often. Causes frequent or very bad symptoms. Causes problems such as damage to the esophagus. When this happens, the esophagus becomes sore and swollen. Over time, GERD can make small holes (ulcers) in the lining of the esophagus. What are the causes? This condition is caused by a problem with the muscle between the esophagus and the stomach. When this muscle is weak or not normal, it does not close properly to keep food and acid from coming back up from the stomach. The muscle can be weak because of: Tobacco use. Pregnancy. Having a certain type of hernia (hiatal hernia). Alcohol use. Certain foods and drinks, such as coffee, chocolate, onions, and peppermint. What increases the risk? Being overweight. Having a disease that affects your connective tissue. Taking NSAIDs, such a ibuprofen. What are the  signs or symptoms? Heartburn. Difficult or painful swallowing. The feeling of having a lump in the throat. A bitter taste in the mouth. Bad breath. Having a lot of saliva. Having an upset or bloated stomach. Burping. Chest pain. Different conditions can cause chest pain. Make sure you see your doctor if you have chest pain. Shortness of breath or wheezing. A long-term cough or a cough at night. Wearing away of the surface of teeth (tooth enamel). Weight loss. How is this treated? Making changes to your diet. Taking medicine. Having surgery. Treatment will depend on how bad your symptoms are. Follow these instructions at home: Eating and drinking  Follow a diet as told by your doctor. You may need to avoid foods and drinks such as: Coffee and tea, with or without caffeine. Drinks that contain alcohol. Energy drinks and sports drinks. Bubbly (carbonated) drinks or sodas. Chocolate and cocoa. Peppermint and mint flavorings. Garlic and onions. Horseradish. Spicy and acidic foods. These include peppers, chili powder, curry powder, vinegar, hot sauces, and BBQ sauce. Citrus fruit juices and citrus fruits, such as oranges, lemons, and limes. Tomato-based foods. These include red sauce, chili, salsa, and pizza with red sauce. Fried and fatty foods. These include donuts, french fries, potato chips, and high-fat dressings. High-fat meats. These include hot dogs, rib eye steak, sausage, ham, and bacon. High-fat dairy items, such as whole milk, butter, and cream cheese. Eat small meals often. Avoid eating large meals. Avoid drinking large amounts of liquid with your meals. Avoid eating meals during the 2-3 hours before bedtime. Avoid lying down right after you eat. Do not  exercise right after you eat. Lifestyle  Do not smoke or use any products that contain nicotine or tobacco. If you need help quitting, ask your doctor. Try to lower your stress. If you need help doing this, ask your  doctor. If you are overweight, lose an amount of weight that is healthy for you. Ask your doctor about a safe weight loss goal. General instructions Pay attention to any changes in your symptoms. Take over-the-counter and prescription medicines only as told by your doctor. Do not take aspirin, ibuprofen, or other NSAIDs unless your doctor says it is okay. Wear loose clothes. Do not wear anything tight around your waist. Raise (elevate) the head of your bed about 6 inches (15 cm). You may need to use a wedge to do this. Avoid bending over if this makes your symptoms worse. Keep all follow-up visits. Contact a doctor if: You have new symptoms. You lose weight and you do not know why. You have trouble swallowing or it hurts to swallow. You have wheezing or a cough that keeps happening. You have a hoarse voice. Your symptoms do not get better with treatment. Get help right away if: You have sudden pain in your arms, neck, jaw, teeth, or back. You suddenly feel sweaty, dizzy, or light-headed. You have chest pain or shortness of breath. You vomit and the vomit is green, yellow, or black, or it looks like blood or coffee grounds. You faint. Your poop (stool) is red, bloody, or black. You cannot swallow, drink, or eat. These symptoms may represent a serious problem that is an emergency. Do not wait to see if the symptoms will go away. Get medical help right away. Call your local emergency services (911 in the [place] Do not drive yourself to the hospital. Summary If a person has gastroesophageal reflux disease (GERD), food and stomach acid move back up into the esophagus and cause symptoms or problems such as damage to the esophagus. Treatment will depend on how bad your symptoms are. Follow a diet as told by your doctor. Take all medicines only as told by your doctor. This information is not intended to replace advice given to you by your health care provider. Make sure you discuss any  questions you have with your health care provider. Document Revised: 02/11/2020 Document Reviewed: 02/11/2020 Elsevier Patient Education  2022 Elsevier [employer]. The [facility] providers would like to encourage you to use [facility] to communicate with providers for non-urgent requests or questions.  Due to long hold times on the telephone, sending your provider a message by [facility] may be faster and more efficient way to get a response. Please allow 48 business hours for a response.  Please remember that this is for non-urgent requests/questions. If you are age 81 or older, your body mass index should be between 23-30. Your Body mass index is 28.22 kg/m. If this is out of the aforementioned range listed, please consider follow up with your Primary Care Provider.  If you are age 58 or younger, your body mass index should be between 19-25. Your Body mass index is 28.22 kg/m. If this is out of the aformentioned range listed, please consider follow up with your Primary Care Provider.

## 2021-07-24 NOTE — Progress Notes (Signed)
07/24/2021 Olivia Page [id number] June 19, 1966   CHIEF COMPLAINT: Schedule an EGD and colonoscopy   HISTORY OF PRESENT ILLNESS:  Olivia Page is a 55 year old female with a past medical history of anxiety, hypertension, thyroid follicular adenoma s/p total thyroidectomy resulting in hypothyroidism, arthritis, migraine headaches and osteopenia. She presents to our office today as recommended by her gynecologist Dr. Marcelle Overlie for further evaluation for GERD and to schedule a screening colonoscopy. She does not have a primary care physician.  She reports having heartburn for more than 20 years for which she previously took Tums or Pepcid.  She started taking Prilosec 20 mg OTC a few weeks ago and her heartburn has reduced but has not abated.  She sometimes has difficulty swallowing meat, passes down the esophagus slowly but does not get stuck.  No upper or lower abdominal pain.  She underwent an EGD more than 20 years ago, no significant findings.  Her mother needed to have her esophagus stretched in the past.  She reports passing a normal formed brown bowel movement daily.  No rectal bleeding or black stools.  She underwent a flexible sigmoidoscopy more than 20 years ago due to having constipation which was normal.  She takes ibuprofen 600 mg 1 tab every other day for left knee pain or migraine headaches.  Past Medical History:  Diagnosis Date   Anxiety    Blood transfusion without reported diagnosis    Hypertension    Past Surgical History:  Procedure Laterality Date   ABDOMINAL HYSTERECTOMY     BREAST LUMPECTOMY Right 1998   benign lump removed    BREAST SURGERY     REDUCTION MAMMAPLASTY Bilateral    TOTAL THYROIDECTOMY  2011   Nausea and vomiting with anesthesia   Social History: She is married.  She has 1 daughter and 1 son.  She is employed in [employer].  Non-smoker.  No alcohol use.  No drug use.  Family History: Mother rheumatoid arthritis, fibromyalgia and  osteoarthritis. Father deceased age 28 massive CVA. Brother massive CVA. Maternal grandmother had ovarian cancer. Maternal great grandmother breast cancer. Paternal grandfather bone cancer. Paternal grandmother possibly had stomach or colon cancer.    Allergies  Allergen Reactions   Sulfa Antibiotics Nausea And Vomiting and Nausea Only   Codeine     Violently ill     Outpatient Encounter Medications as of 07/24/2021  Medication Sig   ALPRAZolam (XANAX) 0.5 MG tablet Take 0.5 mg by mouth at bedtime as needed.   cholecalciferol (VITAMIN D) 1000 units tablet Take 1,000 Units by mouth daily.   fluticasone (FLONASE) 50 MCG/ACT nasal spray Place 2 sprays into both nostrils daily. (Patient not taking: Reported on 04/28/2021)   lisinopril (PRINIVIL,ZESTRIL) 10 MG tablet Take 10 mg by mouth daily.   Multiple Vitamin (MULTIVITAMIN) tablet Take 1 tablet by mouth daily.   SYNTHROID 100 MCG tablet TAKE 1 TABLET MONDAY-SATURDAY AND 1/2 TABLET ON SUNDAY.   No facility-administered encounter medications on file as of 07/24/2021.   REVIEW OF SYSTEMS:  Gen: Denies fever, sweats or chills. No weight loss.  CV: Denies chest pain, palpitations or edema. Resp: Denies cough, shortness of breath of hemoptysis.  GI: See HPI. GU : Denies urinary burning, blood in urine, increased urinary frequency or incontinence. MS: Denies joint pain, muscles aches or weakness. Derm: Denies rash, itchiness, skin lesions or unhealing ulcers. Psych: + Anxiety.  Heme: Denies bruising, bleeding. Neuro:  Denies headaches, dizziness or paresthesias. Endo:  Denies any problems with DM, thyroid or adrenal function.  PHYSICAL EXAM: BP 116/70   Pulse 76   Ht 5' 4.5" (1.638 m)   Wt 167 lb (75.8 kg)   BMI 28.22 kg/m  General: 55 year old female in no acute distress. Head: Normocephalic and atraumatic. Eyes:  Sclerae non-icteric, conjunctive pink. Ears: Normal auditory acuity. Mouth: Dentition intact. No ulcers or lesions.   Neck: Supple, no lymphadenopathy or thyromegaly.  Lungs: Clear bilaterally to auscultation without wheezes, crackles or rhonchi. Heart: Regular rate and rhythm. No murmur, rub or gallop appreciated.  Abdomen: Soft, nontender, non distended. Lower midline scar intact.  No masses. No hepatosplenomegaly. Normoactive bowel sounds x 4 quadrants.  Rectal: Deferred. Musculoskeletal: Symmetrical with no gross deformities. Skin: Warm and dry. No rash or lesions on visible extremities. Extremities: No edema. Neurological: Alert oriented x 4, no focal deficits.  Psychological:  Alert and cooperative. Normal mood and affect.  ASSESSMENT AND PLAN:  77) 55 year old female with chronic GERD, dysphagia with meat  -EGD benefits and risks discussed including risk with sedation, risk of bleeding, perforation and infection  -Omeprazole 20mg  po bid -Famotidine 20 mg nightly as needed -GERD handout  2) Screening colonoscopy -Colonoscopy benefits and risks discussed including risk with sedation, risk of bleeding, perforation and infection   Further recommendations to be determined after the above evaluation completed         CC:  No ref. provider found

## 2021-07-26 NOTE — Progress Notes (Signed)
Attending Physician's Attestation   I have reviewed the chart.   I agree with the Advanced Practitioner's note, impression, and recommendations with any updates as below.    Kasumi Ditullio Mansouraty, MD [facility] Office # [phone]  

## 2021-07-27 ENCOUNTER — Telehealth: Payer: Self-pay | Admitting: Gastroenterology

## 2021-07-27 NOTE — Telephone Encounter (Signed)
Patient called states the whole family is running fevers and she is not able to come in for tomorrows procedure.

## 2021-07-27 NOTE — Telephone Encounter (Signed)
I am sorry to hear this. No late cancellation fee to be assessed for this first cancellation. Please place a recall in the system for 3 months so she has not rescheduled by then that she will have a reminder in the system. Thanks. GM

## 2021-07-28 ENCOUNTER — Encounter: Payer: BC Managed Care – PPO | Admitting: Gastroenterology

## 2021-08-21 ENCOUNTER — Other Ambulatory Visit: Payer: Self-pay | Admitting: Obstetrics and Gynecology

## 2021-08-21 DIAGNOSIS — Z1231 Encounter for screening mammogram for malignant neoplasm of breast: Secondary | ICD-10-CM

## 2021-08-23 ENCOUNTER — Other Ambulatory Visit: Payer: Self-pay | Admitting: Endocrinology

## 2021-08-23 DIAGNOSIS — E890 Postprocedural hypothyroidism: Secondary | ICD-10-CM

## 2021-09-28 ENCOUNTER — Ambulatory Visit: Payer: BC Managed Care – PPO

## 2021-09-29 ENCOUNTER — Ambulatory Visit
Admission: RE | Admit: 2021-09-29 | Discharge: 2021-09-29 | Disposition: A | Discharge status: Home / Self Care | Payer: 59 | Source: Ambulatory Visit | Attending: Obstetrics and Gynecology | Admitting: Obstetrics and Gynecology

## 2021-09-29 DIAGNOSIS — Z1231 Encounter for screening mammogram for malignant neoplasm of breast: Secondary | ICD-10-CM

## 2021-10-05 ENCOUNTER — Ambulatory Visit
Admission: EM | Admit: 2021-10-05 | Discharge: 2021-10-05 | Disposition: A | Discharge status: Home / Self Care | Payer: 59 | Attending: Internal Medicine | Admitting: Internal Medicine

## 2021-10-05 ENCOUNTER — Encounter: Payer: Self-pay | Admitting: Emergency Medicine

## 2021-10-05 ENCOUNTER — Other Ambulatory Visit: Payer: Self-pay

## 2021-10-05 DIAGNOSIS — J069 Acute upper respiratory infection, unspecified: Secondary | ICD-10-CM | POA: Diagnosis not present

## 2021-10-05 MED ORDER — BENZONATATE 100 MG PO CAPS: 100.0000 mg | ORAL_CAPSULE | Freq: Three times a day (TID) | ORAL | 0 refills | Status: DC | PRN

## 2021-10-05 MED ORDER — FLUTICASONE PROPIONATE 50 MCG/ACT NA SUSP: 1.0000 | Freq: Every day | NASAL | 0 refills | Status: DC

## 2021-10-05 NOTE — Discharge Instructions (Signed)
It appears that you have a viral upper respiratory infection that should run its course and self resolve in the next few days.  You have been prescribed 2 medications to alleviate symptoms.  No antibiotics needed as this appears viral as opposed to bacterial, which is what antibiotics to treat.  Please follow-up if symptoms persist or worsen.

## 2021-10-05 NOTE — ED Triage Notes (Signed)
Headache, facial tenderness, cough, nasal congestion, daughter has been sick - she was treated with a z-pack and steroids, symptoms improved. Pt believes she has a sinus infection

## 2021-10-05 NOTE — ED Provider Notes (Signed)
[name]    CSN: [id number] Arrival date & time: 10/05/21  5361      History   Chief Complaint Chief Complaint  Patient presents with   Cough   Facial Pain    HPI Olivia Page is a 56 y.o. female.   Patient presents with headache, sinus pressure, cough, nasal congestion that has been present for approximately 2 to 3 days.  Her daughter and husband currently have similar symptoms.  Patient denies any known fevers.  Denies chest pain, shortness of breath, sore throat, ear pain, nausea, vomiting, diarrhea, abdominal pain.   Cough  Past Medical History:  Diagnosis Date   Anxiety    Blood transfusion without reported diagnosis    Hypertension     Patient Active Problem List   Diagnosis Date Noted   Abnormal cervical Papanicolaou smear 07/22/2021   Hypertensive disorder 09/25/2019   Migraine 09/25/2019   History of thyroidectomy 09/25/2019   Costochondral chest pain 03/23/2016   Postsurgical hypothyroidism 10/01/2013   CELLULITIS AND ABSCESS OF FACE 02/14/2009   Essential hypertension 02/12/2009    Past Surgical History:  Procedure Laterality Date   ABDOMINAL HYSTERECTOMY     BREAST LUMPECTOMY Right 1998   benign lump removed    BREAST SURGERY     KNEE SURGERY Left 02/11/2021   REDUCTION MAMMAPLASTY Bilateral    TOTAL THYROIDECTOMY  2011    OB History   No obstetric history on file.      Home Medications    Prior to Admission medications   Medication Sig Start Date End Date Taking? Authorizing Provider  benzonatate (TESSALON) 100 MG capsule Take 1 capsule (100 mg total) by mouth every 8 (eight) hours as needed for cough. 10/05/21  Yes Kimiko Common, Rolly Salter E, FNP  fluticasone (FLONASE) 50 MCG/ACT nasal spray Place 1 spray into both nostrils daily for 3 days. 10/05/21 10/08/21 Yes Arhan Mcmanamon, Acie Fredrickson, FNP  ALPRAZolam Prudy Feeler) 0.5 MG tablet Take 0.5 mg by mouth at bedtime as needed.    [provider]  cholecalciferol (VITAMIN D) 1000 units  tablet Take 1,000 Units by mouth daily.    [provider]  lisinopril (PRINIVIL,ZESTRIL) 10 MG tablet Take 10 mg by mouth daily. 08/13/14   [provider]  Multiple Vitamin (MULTIVITAMIN) tablet Take 1 tablet by mouth daily.    [provider]  Omeprazole Magnesium (PRILOSEC PO) Take 1 mg by mouth as needed.    [provider]  SYNTHROID 100 MCG tablet TAKE 1 TABLET MONDAY-SATURDAY AND 1/2 TABLET ON SUNDAY. 08/25/21   [name], MD    Family History Family History  Problem Relation Age of Onset   Hypertension Mother    Diverticulitis Mother    Stroke Father 31   Cancer Maternal Grandmother    Cancer Maternal Grandfather    Cancer Paternal Grandmother    Cancer Paternal Grandfather    Alcohol abuse Brother        in recovery   Breast cancer Neg Hx     Social History Social History   Tobacco Use   Smoking status: Never   Smokeless tobacco: Never  Substance Use Topics   Alcohol use: No    Alcohol/week: 0.0 standard drinks   Drug use: No     Allergies   Sulfa antibiotics and Codeine   Review of Systems Review of Systems Per HPI  Physical Exam Triage Vital Signs ED Triage Vitals [10/05/21 0921]  Enc Vitals Group     BP 138/90  Pulse Rate 70     Resp 16     Temp 98 F (36.7 C)     Temp Source Oral     SpO2 98 %     Weight      Height      Head Circumference      Peak Flow      Pain Score 0     Pain Loc      Pain Edu?      Excl. in GC?    No data found.  Updated Vital Signs BP 138/90 (BP Location: Left Arm)    Pulse 70    Temp 98 F (36.7 C) (Oral)    Resp 16    SpO2 98%   Visual Acuity Right Eye Distance:   Left Eye Distance:   Bilateral Distance:    Right Eye Near:   Left Eye Near:    Bilateral Near:     Physical Exam Constitutional:      General: She is not in acute distress.    Appearance: Normal appearance. She is not toxic-appearing or diaphoretic.  HENT:     Head: Normocephalic and atraumatic.      Right Ear: Tympanic membrane and ear canal normal.     Left Ear: Tympanic membrane and ear canal normal.     Nose: Congestion present.     Mouth/Throat:     Mouth: Mucous membranes are moist.     Pharynx: No posterior oropharyngeal erythema.  Eyes:     Extraocular Movements: Extraocular movements intact.     Conjunctiva/sclera: Conjunctivae normal.     Pupils: Pupils are equal, round, and reactive to light.  Cardiovascular:     Rate and Rhythm: Normal rate and regular rhythm.     Pulses: Normal pulses.     Heart sounds: Normal heart sounds.  Pulmonary:     Effort: Pulmonary effort is normal. No respiratory distress.     Breath sounds: Normal breath sounds. No stridor. No wheezing, rhonchi or rales.  Abdominal:     General: Abdomen is flat. Bowel sounds are normal.     Palpations: Abdomen is soft.  Musculoskeletal:        General: Normal range of motion.     Cervical back: Normal range of motion.  Skin:    General: Skin is warm and dry.  Neurological:     General: No focal deficit present.     Mental Status: She is alert and oriented to person, place, and time. Mental status is at baseline.  Psychiatric:        Mood and Affect: Mood normal.        Behavior: Behavior normal.     UC Treatments / Results  Labs (all labs ordered are listed, but only abnormal results are displayed) Labs Reviewed - No data to display  EKG   Radiology No results found.  Procedures Procedures (including critical care time)  Medications Ordered in UC Medications - No data to display  Initial Impression / Assessment and Plan / UC Course  I have reviewed the triage vital signs and the nursing notes.  Pertinent labs & imaging results that were available during my care of the patient were reviewed by me and considered in my medical decision making (see chart for details).     Patient presents with symptoms likely from a viral upper respiratory infection. Differential includes  bacterial pneumonia, sinusitis, allergic rhinitis, COVID-19, flu. Do not suspect underlying cardiopulmonary process. Symptoms seem unlikely related to  ACS, CHF or COPD exacerbations, pneumonia, pneumothorax. Patient is nontoxic appearing and not in need of emergent medical intervention.  Offered patient viral testing for COVID and flu but she declined.  Recommended symptom control with over the counter medications.   Patient sent prescriptions.  Return if symptoms fail to improve in 1-2 weeks or you develop shortness of breath, chest pain, severe headache. Patient states understanding and is agreeable.  Discharged with PCP followup.  Final Clinical Impressions(s) / UC Diagnoses   Final diagnoses:  Viral upper respiratory tract infection with cough     Discharge Instructions      It appears that you have a viral upper respiratory infection that should run its course and self resolve in the next few days.  You have been prescribed 2 medications to alleviate symptoms.  No antibiotics needed as this appears viral as opposed to bacterial, which is what antibiotics to treat.  Please follow-up if symptoms persist or worsen.    ED Prescriptions     Medication Sig Dispense Auth. Provider   fluticasone (FLONASE) 50 MCG/ACT nasal spray Place 1 spray into both nostrils daily for 3 days. 16 g Rozalia Dino, Rolly Salter E, [place]   benzonatate (TESSALON) 100 MG capsule Take 1 capsule (100 mg total) by mouth every 8 (eight) hours as needed for cough. 21 capsule Bradley, [place] E, [place]      I have reviewed the PDMP during this encounter.   Gustavus Bryant, [place] 10/05/21 1046

## 2021-11-05 ENCOUNTER — Encounter: Payer: Self-pay | Admitting: Gastroenterology

## 2021-11-16 ENCOUNTER — Encounter: Payer: Self-pay | Admitting: Gastroenterology

## 2021-12-02 ENCOUNTER — Other Ambulatory Visit: Payer: Self-pay | Admitting: Endocrinology

## 2021-12-02 DIAGNOSIS — E890 Postprocedural hypothyroidism: Secondary | ICD-10-CM

## 2022-05-17 ENCOUNTER — Ambulatory Visit: Payer: 59 | Admitting: Endocrinology

## 2022-06-17 ENCOUNTER — Other Ambulatory Visit: Payer: Self-pay | Admitting: Endocrinology

## 2022-06-17 ENCOUNTER — Telehealth: Payer: Self-pay

## 2022-06-17 DIAGNOSIS — E890 Postprocedural hypothyroidism: Secondary | ICD-10-CM

## 2022-06-17 NOTE — Telephone Encounter (Signed)
Per pharmacy SYNTHROID 100 MCG tablet [Pharmacy Med Name: SYNTHROID 100 MCG TABLET] is not on the preferred formulary for the patient's insurance plan. Below are alternatives which are likely to be more affordable. Do not assume that every medication presented is a clinically appropriate alternative. Please advise. Euthyrox is alternative popping up but no 128mcg.

## 2022-06-18 DIAGNOSIS — E890 Postprocedural hypothyroidism: Secondary | ICD-10-CM

## 2022-06-18 MED ORDER — LEVOTHYROXINE SODIUM 100 MCG PO TABS: ORAL_TABLET | ORAL | 2 refills | Status: DC

## 2022-06-18 NOTE — Addendum Note (Signed)
Addended by: Cinda Quest on: 06/18/2022 08:18 AM   Modules accepted: Orders

## 2022-06-22 ENCOUNTER — Other Ambulatory Visit: Payer: Self-pay | Admitting: Endocrinology

## 2022-06-22 DIAGNOSIS — E890 Postprocedural hypothyroidism: Secondary | ICD-10-CM

## 2022-06-22 MED ORDER — LEVOTHYROXINE SODIUM 100 MCG PO TABS: ORAL_TABLET | ORAL | 2 refills | Status: DC

## 2022-06-22 NOTE — Telephone Encounter (Signed)
RX now sent to preferred pharmacy.  

## 2022-06-22 NOTE — Telephone Encounter (Signed)
Patient is requesting that the Synthroid be sent to  [facility], [place] Phone: [phone]  Fax: [phone]      Has been out for 2 days

## 2022-06-23 ENCOUNTER — Telehealth: Payer: Self-pay | Admitting: Endocrinology

## 2022-06-23 DIAGNOSIS — E890 Postprocedural hypothyroidism: Secondary | ICD-10-CM

## 2022-06-23 MED ORDER — SYNTHROID 100 MCG PO TABS: ORAL_TABLET | ORAL | 2 refills | Status: DC

## 2022-06-23 NOTE — Telephone Encounter (Signed)
Medication was sent but was not marked to prescribe brand name resent rx

## 2022-06-23 NOTE — Telephone Encounter (Signed)
Patient is calling to say that she is very upset about about the process that she has gone through to try to get the correct medication sent in.  Patient states that she ONLY takes Name brand Synthroid and she has not taken Levothyroxine in years because per the pharmacist at [facility] that is what caused her bone problem.  She wants her record to state that she only takes the name brand Synthroid too.  Patient also states that she this office has blown her phone up with phone calls and she would like to know what all the calls were about.  Patient also states that [facility] told her that she could use My Good RX prescription card and get her medication for $39.00 when she has been paying $55.00.  Patient also states that "if this office does not want to see her then she will find another endocrinology office".

## 2022-07-26 ENCOUNTER — Telehealth: Payer: Self-pay | Admitting: Endocrinology

## 2022-07-26 DIAGNOSIS — E890 Postprocedural hypothyroidism: Secondary | ICD-10-CM

## 2022-07-26 MED ORDER — SYNTHROID 100 MCG PO TABS: ORAL_TABLET | ORAL | 2 refills | Status: DC

## 2022-07-26 NOTE — Telephone Encounter (Signed)
RX now sent to preferred pharmacy.  

## 2022-07-26 NOTE — Telephone Encounter (Signed)
MEDICATION:  Synthroid SYNTHROID 100 MCG table  PHARMACY:    [id number] [place], [place] - [address] AT [facility] OF [address] (Ph: [phone])    HAS THE PATIENT CONTACTED THEIR PHARMACY?  Yes  IS THIS A 90 DAY SUPPLY : Yes  IS PATIENT OUT OF MEDICATION: Yes  IF NOT; HOW MUCH IS LEFT:   LAST APPOINTMENT DATE: @ 04/28/2021  NEXT APPOINTMENT DATE:@12 /21/2023  DO WE HAVE YOUR PERMISSION TO LEAVE A DETAILED MESSAGE?: Yes  OTHER COMMENTS:    **Let patient know to contact pharmacy at the end of the day to make sure medication is ready. **  ** Please notify patient to allow 48-72 hours to process**  **Encourage patient to contact the pharmacy for refills or they can request refills through [facility]**

## 2022-07-29 ENCOUNTER — Other Ambulatory Visit: Payer: Self-pay | Admitting: Endocrinology

## 2022-07-29 DIAGNOSIS — E890 Postprocedural hypothyroidism: Secondary | ICD-10-CM

## 2022-07-30 ENCOUNTER — Other Ambulatory Visit (INDEPENDENT_AMBULATORY_CARE_PROVIDER_SITE_OTHER): Payer: 59

## 2022-07-30 DIAGNOSIS — E890 Postprocedural hypothyroidism: Secondary | ICD-10-CM | POA: Diagnosis not present

## 2022-07-30 LAB — TSH: TSH: 4.7 u[IU]/mL (ref 0.35–5.50)

## 2022-07-30 LAB — T4, FREE: Free T4: 0.9 ng/dL (ref 0.60–1.60)

## 2022-08-04 ENCOUNTER — Other Ambulatory Visit: Payer: 59

## 2022-08-05 ENCOUNTER — Other Ambulatory Visit: Payer: 59

## 2022-08-05 ENCOUNTER — Encounter: Payer: Self-pay | Admitting: Endocrinology

## 2022-08-05 ENCOUNTER — Ambulatory Visit (INDEPENDENT_AMBULATORY_CARE_PROVIDER_SITE_OTHER): Payer: 59 | Admitting: Endocrinology

## 2022-08-05 VITALS — BP 114/82 | HR 104 | Ht 64.5 in | Wt 168.8 lb

## 2022-08-05 DIAGNOSIS — E890 Postprocedural hypothyroidism: Secondary | ICD-10-CM

## 2022-08-05 NOTE — Progress Notes (Signed)
Patient ID: Olivia Page, female   DOB: January 16, 1966, 56 y.o.   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   The patient has been treated with Synthroid or levothyroxine initially in variable doses  In 2019 she wanted to switch to the brand-name SYNTHROID  She is taking brand name Synthroid 100 mcg since 12/19 Currently taking 1 tablet daily and has not missed any doses Last visit was in 9/22  She is again concerned about her weight gain although this appears to be unchanged since last year At times may feel fatigued and also has some cold intolerance Not able to exercise because of knee pain      She is taking the Synthroid consistently in the morning around 30 minutes before breakfast with water  She is concerned about the cost of brand-name Synthroid but did not call back after giving her the information about the Synthroid delivers program  She is not taking any vitamin supplements in the morning  Her TSH is high normal with slightly lower free T4  Wt Readings from Last 3 Encounters:  08/05/22 168 lb 12.8 oz (76.6 kg)  07/24/21 167 lb (75.8 kg)  04/28/21 159 lb 3.2 oz (72.2 kg)    Lab Results  Component Value Date   TSH 4.70 07/30/2022   TSH 0.93 04/27/2021   TSH 2.99 02/28/2020   FREET4 0.90 07/30/2022   FREET4 1.10 04/27/2021   FREET4 1.13 02/28/2020     Allergies as of 08/05/2022       Reactions   Sulfa Antibiotics Nausea And Vomiting, Nausea Only   Codeine    Violently ill        Medication List        Accurate as of August 05, 2022 11:59 PM. If you have any questions, ask your nurse or doctor.          ALPRAZolam 0.5 MG tablet Commonly  known as: XANAX Take 0.5 mg by mouth at bedtime as needed.   benzonatate 100 MG capsule Commonly known as: TESSALON Take 1 capsule (100 mg total) by mouth every 8 (eight) hours as needed for cough.   cholecalciferol 1000 units tablet Commonly known as: VITAMIN D Take 1,000 Units by mouth daily.   fluticasone 50 MCG/ACT nasal spray Commonly known as: FLONASE Place 1 spray into both nostrils daily for 3 days.   lisinopril 10 MG tablet Commonly known as: ZESTRIL Take 10 mg by mouth daily.   multivitamin tablet Take 1 tablet by mouth daily.   PRILOSEC PO Take 1 mg by mouth as needed.   Synthroid 100 MCG tablet Generic drug: levothyroxine TAKE 1 TABLET MONDAY-SATURDAY AND 1/2 TABLET ON SUNDAY.        Allergies:  Allergies  Allergen Reactions   Sulfa Antibiotics Nausea And Vomiting and Nausea Only   Codeine     Violently ill    Past Medical History:  Diagnosis Date   Anxiety    Blood transfusion without reported diagnosis    Hypertension     Past Surgical History:  Procedure Laterality Date   ABDOMINAL HYSTERECTOMY     BREAST LUMPECTOMY Right 1998   benign lump removed  BREAST SURGERY     KNEE SURGERY Left 02/11/2021   REDUCTION MAMMAPLASTY Bilateral    TOTAL THYROIDECTOMY  2011    Family History  Problem Relation Age of Onset   Hypertension Mother    Diverticulitis Mother    Stroke Father 17   Cancer Maternal Grandmother    Cancer Maternal Grandfather    Cancer Paternal Grandmother    Cancer Paternal Grandfather    Alcohol abuse Brother        in recovery   Breast cancer Neg Hx     Social History:  reports that she has never smoked. She has never used smokeless tobacco. She reports that she does not drink alcohol and does not use drugs.  REVIEW Of SYSTEMS:   She has a history of hypertension followed by her gynecologist and treated with lisinopril 10 mg  BP Readings from Last 3 Encounters:  08/05/22 114/82  10/05/21 138/90  07/24/21  116/70    She was told to have osteopenia by her gynecologist    Examination:   BP 114/82   Pulse (!) 104   Ht 5' 4.5" (1.638 m)   Wt 168 lb 12.8 oz (76.6 kg)   SpO2 95%   BMI 28.53 kg/m      Assessment/Plan:    Hypothyroidism, postsurgical   She does not have any consistent fatigue but does have some cold intolerance No recent weight change  Continues to take brand-name Synthroid which she prefers Again concerned about the cost of this She has taken her regimen consistently daily  TSH is 4.7 compared to 0.9 last year  She will continue the same prescription of 100 mcg but take an extra half pill on Sundays  She was given brochure and required information on Synthroid delivers program New prescription sent in  Follow-up in 6 months  Patient Instructions  Extra 1/2 synthroid  on Sundays    [name] 08/06/2022, 11:16 AM

## 2022-08-05 NOTE — Patient Instructions (Signed)
Extra 1/2 synthroid  on Sundays

## 2022-08-06 MED ORDER — SYNTHROID 100 MCG PO TABS: ORAL_TABLET | ORAL | 2 refills | Status: DC

## 2022-08-12 ENCOUNTER — Ambulatory Visit: Payer: 59 | Admitting: Endocrinology

## 2022-08-19 ENCOUNTER — Other Ambulatory Visit: Payer: Self-pay | Admitting: Obstetrics and Gynecology

## 2022-08-19 DIAGNOSIS — Z1231 Encounter for screening mammogram for malignant neoplasm of breast: Secondary | ICD-10-CM

## 2022-08-27 ENCOUNTER — Other Ambulatory Visit: Payer: 59

## 2022-08-31 ENCOUNTER — Ambulatory Visit: Payer: 59 | Admitting: Endocrinology

## 2022-10-01 ENCOUNTER — Telehealth: Payer: Self-pay | Admitting: Endocrinology

## 2022-10-01 NOTE — Telephone Encounter (Signed)
Patient is calling to say that she was having trouble halving her  Synthroid SYNTHROID 100 MCG tablet and wants to know what she needs to do to get the dosage right.

## 2022-10-04 NOTE — Telephone Encounter (Signed)
Lvm for pt to contact office to schedule follow up lab and office visit. Also advised a pill cutter can assist with halving her pill.

## 2022-10-05 ENCOUNTER — Other Ambulatory Visit: Payer: 59

## 2022-10-05 ENCOUNTER — Ambulatory Visit
Admission: RE | Admit: 2022-10-05 | Discharge: 2022-10-05 | Disposition: A | Discharge status: Home / Self Care | Payer: 59 | Source: Ambulatory Visit | Attending: Obstetrics and Gynecology | Admitting: Obstetrics and Gynecology

## 2022-10-05 DIAGNOSIS — Z1231 Encounter for screening mammogram for malignant neoplasm of breast: Secondary | ICD-10-CM

## 2022-10-11 ENCOUNTER — Ambulatory Visit: Payer: 59

## 2022-10-11 NOTE — Telephone Encounter (Signed)
Message left on cell phone voice mail for patient to call office to schedule follow up in about 2 months and labs 2-3 days prior to visit.

## 2022-11-01 ENCOUNTER — Telehealth: Payer: Self-pay | Admitting: Endocrinology

## 2022-11-01 DIAGNOSIS — E890 Postprocedural hypothyroidism: Secondary | ICD-10-CM

## 2022-11-01 MED ORDER — SYNTHROID 100 MCG PO TABS: ORAL_TABLET | ORAL | 2 refills | Status: DC

## 2022-11-01 NOTE — Telephone Encounter (Signed)
MEDICATION:  Synthroid SYNTHROID 100 MCG tablet  PHARMACY:  [facility] [place], [place] [address] (Ph: [phone])   HAS THE PATIENT CONTACTED THEIR PHARMACY?  Yes  IS THIS A 90 DAY SUPPLY : Yes  IS PATIENT OUT OF MEDICATION: Yes  IF NOT; HOW MUCH IS LEFT:   LAST APPOINTMENT DATE: @2 /16/2024  NEXT APPOINTMENT DATE:@Visit  date not found  DO WE HAVE YOUR PERMISSION TO LEAVE A DETAILED MESSAGE?:Yes  OTHER COMMENTS:    **Let patient know to contact pharmacy at the end of the day to make sure medication is ready. **  ** Please notify patient to allow 48-72 hours to process**  **Encourage patient to contact the pharmacy for refills or they can request refills through [facility]**

## 2022-11-01 NOTE — Telephone Encounter (Signed)
RX now sent ?

## 2023-06-29 ENCOUNTER — Encounter: Payer: Self-pay | Admitting: Gastroenterology

## 2023-07-29 ENCOUNTER — Ambulatory Visit: Payer: 59

## 2023-07-29 VITALS — Ht 64.5 in | Wt 160.0 lb

## 2023-07-29 DIAGNOSIS — Z1211 Encounter for screening for malignant neoplasm of colon: Secondary | ICD-10-CM

## 2023-07-29 DIAGNOSIS — K219 Gastro-esophageal reflux disease without esophagitis: Secondary | ICD-10-CM

## 2023-07-29 MED ORDER — NA SULFATE-K SULFATE-MG SULF 17.5-3.13-1.6 GM/177ML PO SOLN: 1.0000 | Freq: Once | ORAL | 0 refills | Status: AC

## 2023-07-29 NOTE — Progress Notes (Signed)
No egg or soy allergy known to patient  No issues known to pt with past sedation with any surgeries or procedures Patient denies ever being told they had issues or difficulty with intubation  No FH of Malignant Hyperthermia Pt is not on diet pills Pt is not on  home 02  Pt is not on blood thinners  Pt denies issues with constipation  No A fib or A flutter Have any cardiac testing pending--no Pt instructed to use Singlecare.com or GoodRx for a price reduction on prep  Ambulates independently Patient's chart reviewed by Cathlyn Parsons CNRA prior to previsit and patient appropriate for the [facility].  Previsit completed and red dot placed by patient's name on their procedure day (on provider's schedule).

## 2023-08-11 ENCOUNTER — Telehealth: Payer: Self-pay | Admitting: General Practice

## 2023-08-11 NOTE — Telephone Encounter (Signed)
Copied from CRM [id number]. Topic: Medical Record Request - Other >> Aug 11, 2023  1:59 PM Florestine Avers wrote: Reason for CRM: Patient is requesting that a copy of her medical clearance paper work be printed out and ready for pick up 08/12/2023. Tomorrow is the only time she can stop by the clinic. >> Aug 11, 2023  2:27 PM Denman George wrote: Pt has not been seen by any provider at the [place] location. Unable to assist.

## 2023-08-12 ENCOUNTER — Telehealth: Payer: Self-pay | Admitting: Gastroenterology

## 2023-08-12 NOTE — Telephone Encounter (Signed)
Patient called and stated that she is needing her instruction sent over to her mychart so she can start her prep. Please advise.

## 2023-08-13 ENCOUNTER — Telehealth (HOSPITAL_COMMUNITY): Payer: Self-pay

## 2023-08-13 NOTE — Telephone Encounter (Signed)
Pt was forwarded to ENDO at [facility] for questions concerning her upcoming procedure instructions for January 3rd. RN advised pt to call [facility] Monday morning for her instructions; unless something emergent is needed. Lane Hacker, RN

## 2023-08-15 ENCOUNTER — Telehealth: Payer: Self-pay | Admitting: Gastroenterology

## 2023-08-15 NOTE — Telephone Encounter (Signed)
Called back and left another message letting the patient know I had returned the call regarding medication and instructions

## 2023-08-15 NOTE — Telephone Encounter (Signed)
PT is calling to find out what medications she can take exactly. She never received her instructions. Requesting call back to further discuss.

## 2023-08-18 ENCOUNTER — Encounter: Payer: Self-pay | Admitting: Gastroenterology

## 2023-08-19 ENCOUNTER — Encounter: Payer: Self-pay | Admitting: Gastroenterology

## 2023-08-19 ENCOUNTER — Ambulatory Visit: Payer: 59 | Admitting: Gastroenterology

## 2023-08-19 ENCOUNTER — Ambulatory Visit: Payer: Self-pay | Admitting: General Practice

## 2023-08-19 VITALS — BP 130/78 | HR 81 | Temp 98.4°F | Resp 11 | Ht 64.5 in | Wt 160.0 lb

## 2023-08-19 DIAGNOSIS — K449 Diaphragmatic hernia without obstruction or gangrene: Secondary | ICD-10-CM

## 2023-08-19 DIAGNOSIS — K5730 Diverticulosis of large intestine without perforation or abscess without bleeding: Secondary | ICD-10-CM

## 2023-08-19 DIAGNOSIS — K219 Gastro-esophageal reflux disease without esophagitis: Secondary | ICD-10-CM | POA: Diagnosis not present

## 2023-08-19 DIAGNOSIS — K209 Esophagitis, unspecified without bleeding: Secondary | ICD-10-CM | POA: Diagnosis not present

## 2023-08-19 DIAGNOSIS — K641 Second degree hemorrhoids: Secondary | ICD-10-CM | POA: Diagnosis not present

## 2023-08-19 DIAGNOSIS — K2289 Other specified disease of esophagus: Secondary | ICD-10-CM | POA: Diagnosis not present

## 2023-08-19 DIAGNOSIS — Z1211 Encounter for screening for malignant neoplasm of colon: Secondary | ICD-10-CM | POA: Diagnosis present

## 2023-08-19 DIAGNOSIS — K319 Disease of stomach and duodenum, unspecified: Secondary | ICD-10-CM | POA: Diagnosis not present

## 2023-08-19 DIAGNOSIS — K644 Residual hemorrhoidal skin tags: Secondary | ICD-10-CM | POA: Diagnosis not present

## 2023-08-19 DIAGNOSIS — K3189 Other diseases of stomach and duodenum: Secondary | ICD-10-CM | POA: Diagnosis not present

## 2023-08-19 MED ORDER — ESOMEPRAZOLE MAGNESIUM 40 MG PO CPDR: 40.0000 mg | DELAYED_RELEASE_CAPSULE | Freq: Every day | ORAL | 6 refills | Status: AC

## 2023-08-19 MED ORDER — SODIUM CHLORIDE 0.9 % IV SOLN: 500.0000 mL | Freq: Once | INTRAVENOUS | Status: DC

## 2023-08-19 NOTE — Progress Notes (Signed)
 GASTROENTEROLOGY PROCEDURE H&P NOTE   Primary Care Physician: Patient, No Pcp Per  HPI: Olivia Page is a 58 y.o. female who presents for EGD/Colonoscopy for evaluation of GERD/Dysphagia and Colon Cancer screening.  Past Medical History:  Diagnosis Date   Anxiety    Blood transfusion without reported diagnosis    Hypertension    Migraine headache with aura    Osteopenia    Post-operative nausea and vomiting    Thyroid  disease    Past Surgical History:  Procedure Laterality Date   ABDOMINAL HYSTERECTOMY     BREAST LUMPECTOMY Right 1998   benign lump removed    BREAST SURGERY     KNEE SURGERY Left 02/11/2021   REDUCTION MAMMAPLASTY Bilateral    TOTAL THYROIDECTOMY  2011   Current Outpatient Medications  Medication Sig Dispense Refill   ALPRAZolam (XANAX) 0.5 MG tablet Take 0.5 mg by mouth at bedtime as needed.     levothyroxine  (SYNTHROID ) 100 MCG tablet Take 100 mcg by mouth once a week. Once a week on Sundays     levothyroxine  (SYNTHROID ) 112 MCG tablet Take 112 mcg by mouth every morning.     lisinopril (PRINIVIL,ZESTRIL) 10 MG tablet Take 10 mg by mouth daily.  0   Multiple Vitamin (MULTIVITAMIN) tablet Take 1 tablet by mouth daily.     Omeprazole  Magnesium  (PRILOSEC PO) Take 1 mg by mouth as needed.     ibuprofen (ADVIL) 800 MG tablet Take 800 mg by mouth every 8 (eight) hours as needed for moderate pain (pain score 4-6).     Current Facility-Administered Medications  Medication Dose Route Frequency Provider Last Rate Last Admin   0.9 %  sodium chloride  infusion  500 mL Intravenous Once Mansouraty, Devetta Hagenow Jr., MD        Current Outpatient Medications:    ALPRAZolam (XANAX) 0.5 MG tablet, Take 0.5 mg by mouth at bedtime as needed., Disp: , Rfl:    levothyroxine  (SYNTHROID ) 100 MCG tablet, Take 100 mcg by mouth once a week. Once a week on Sundays, Disp: , Rfl:    levothyroxine  (SYNTHROID ) 112 MCG tablet, Take 112 mcg by mouth every morning., Disp: , Rfl:     lisinopril (PRINIVIL,ZESTRIL) 10 MG tablet, Take 10 mg by mouth daily., Disp: , Rfl: 0   Multiple Vitamin (MULTIVITAMIN) tablet, Take 1 tablet by mouth daily., Disp: , Rfl:    Omeprazole  Magnesium  (PRILOSEC PO), Take 1 mg by mouth as needed., Disp: , Rfl:    ibuprofen (ADVIL) 800 MG tablet, Take 800 mg by mouth every 8 (eight) hours as needed for moderate pain (pain score 4-6)., Disp: , Rfl:   Current Facility-Administered Medications:    0.9 %  sodium chloride  infusion, 500 mL, Intravenous, Once, Mansouraty, Aloha Raddle., MD Allergies  Allergen Reactions   Codeine     Violently ill   Sulfa Antibiotics Nausea And Vomiting and Nausea Only   Family History  Problem Relation Age of Onset   Hypertension Mother    Diverticulitis Mother    Stroke Father 45   Alcohol abuse Brother        in recovery   Cancer Maternal Grandmother    Cancer Maternal Grandfather    Stomach cancer Paternal Grandmother    Cancer Paternal Grandmother    Cancer Paternal Grandfather    Breast cancer Neg Hx    Colon cancer Neg Hx    Esophageal cancer Neg Hx    Rectal cancer Neg Hx    Colon polyps Neg  Hx    Social History   Socioeconomic History   Marital status: Married    Spouse name: Velinda Ash   Number of children: 2   Years of education: Not on file   Highest education level: Not on file  Occupational History   Occupation: sales    Comment: [employer]  Tobacco Use   Smoking status: Never   Smokeless tobacco: Never  Substance and Sexual Activity   Alcohol use: Yes    Comment: seldom   Drug use: No   Sexual activity: Never    Birth control/protection: Abstinence  Other Topics Concern   Not on file  Social History Narrative   Lives with her husband and their two children.   Social Drivers of Corporate Investment Banker Strain: Not on file  Food Insecurity: Not on file  Transportation Needs: Not on file  Physical Activity: Not on file  Stress: Not on file  Social Connections:  Not on file  Intimate Partner Violence: Not on file    Physical Exam: Today's Vitals   08/19/23 1348  BP: 136/85  Pulse: 76  Temp: 98.4 F (36.9 C)  TempSrc: Temporal  SpO2: 99%  Weight: 160 lb (72.6 kg)  Height: 5' 4.5 (1.638 m)   Body mass index is 27.04 kg/m. GEN: NAD EYE: Sclerae anicteric ENT: MMM CV: Non-tachycardic GI: Soft, NT/ND NEURO:  Alert & Oriented x 3  Lab Results: No results for input(s): WBC, HGB, HCT, PLT in the last 72 hours. BMET No results for input(s): NA, K, CL, CO2, GLUCOSE, BUN, CREATININE, CALCIUM in the last 72 hours. LFT No results for input(s): PROT, ALBUMIN, AST, ALT, ALKPHOS, BILITOT, BILIDIR, IBILI in the last 72 hours. PT/INR No results for input(s): LABPROT, INR in the last 72 hours.   Impression / Plan: This is a 58 y.o.female who presents for EGD/Colonoscopy for evaluation of GERD/Dysphagia and Colon Cancer screening.  The risks and benefits of endoscopic evaluation/treatment were discussed with the patient and/or family; these include but are not limited to the risk of perforation, infection, bleeding, missed lesions, lack of diagnosis, severe illness requiring hospitalization, as well as anesthesia and sedation related illnesses.  The patient's history has been reviewed, patient examined, no change in status, and deemed stable for procedure.  The patient and/or family is agreeable to proceed.    Aloha Finner, MD [facility] Office # [phone]

## 2023-08-19 NOTE — Progress Notes (Signed)
 Pt's states no medical or surgical changes since previsit or office visit. Pt stated that she suffers from migraine headaches with aura and post op nausea and vomiting - medical history updated.

## 2023-08-19 NOTE — Progress Notes (Signed)
 Called to room to assist during endoscopic procedure.  Patient ID and intended procedure confirmed with present staff. Received instructions for my participation in the procedure from the performing physician.

## 2023-08-19 NOTE — Telephone Encounter (Signed)
  Chief Complaint: Pt asking is she can drink a soda after completing bowel prep   Additional Notes: spoke with patient regarding clear liquid diet instructions, patient asking if she can drink coca-cola, per chart review, carbonated beverages are allowed, but cannot be purple or red in color. Patient verbalized understanding. Pt also had questions regarding billing and coverage, advised patient to call [facility] billing and/or her insurance company     Reason for Disposition  [1] Follow-up call to recent contact AND [2] information only call, no triage required  Answer Assessment - Initial Assessment Questions 1. REASON FOR CALL or QUESTION: What is your reason for calling today? or How can I best help you? or What question do you have that I can help answer?     Patient asking is she can drink Coca-Cola after drinking bowel prep  Protocols used: Information Only Call - No Triage-A-AH

## 2023-08-19 NOTE — Op Note (Signed)
 [facility] Patient Name: Olivia Page Procedure Date: 08/19/2023 2:47 PM MRN: [id number] Endoscopist: Aloha Finner , MD, [id number] Age: 58 Referring MD:  Date of Birth: [birth year] Gender: Female Account #: [id number] Procedure:                Colonoscopy Indications:              Screening for colorectal malignant neoplasm Medicines:                Monitored Anesthesia Care Procedure:                Pre-Anesthesia Assessment:                           - Prior to the procedure, a History and Physical                            was performed, and patient medications and                            allergies were reviewed. The patient's tolerance of                            previous anesthesia was also reviewed. The risks                            and benefits of the procedure and the sedation                            options and risks were discussed with the patient.                            All questions were answered, and informed consent                            was obtained. Prior Anticoagulants: The patient has                            taken no anticoagulant or antiplatelet agents                            except for NSAID medication. ASA Grade Assessment:                            II - A patient with mild systemic disease. After                            reviewing the risks and benefits, the patient was                            deemed in satisfactory condition to undergo the                            procedure.  After obtaining informed consent, the colonoscope                            was passed under direct vision. Throughout the                            procedure, the patient's blood pressure, pulse, and                            oxygen saturations were monitored continuously. The                            Olympus Scope [id number] was introduced through the                            anus and advanced to the 3  cm into the ileum. The                            colonoscopy was performed without difficulty. The                            patient tolerated the procedure. The quality of the                            bowel preparation was adequate. The terminal ileum,                            ileocecal valve, appendiceal orifice, and rectum                            were photographed. Scope In: 3:03:15 PM Scope Out: 3:13:09 PM Scope Withdrawal Time: 0 hours 7 minutes 57 seconds  Total Procedure Duration: 0 hours 9 minutes 54 seconds  Findings:                 Skin tags were found on perianal exam.                           The digital rectal exam findings include                            hemorrhoids. Pertinent negatives include no                            palpable rectal lesions.                           The terminal ileum and ileocecal valve appeared                            normal.                           Multiple small-mouthed diverticula were found in  the recto-sigmoid colon and sigmoid colon.                           Normal mucosa was found in the entire colon.                           Non-bleeding non-thrombosed external and internal                            hemorrhoids were found during retroflexion, during                            perianal exam and during digital exam. The                            hemorrhoids were Grade II (internal hemorrhoids                            that prolapse but reduce spontaneously). Complications:            No immediate complications. Estimated Blood Loss:     Estimated blood loss was minimal. Impression:               - Perianal skin tags found on perianal exam.                            Hemorrhoids found on digital rectal exam.                           - The examined portion of the ileum was normal.                           - Diverticulosis in the recto-sigmoid colon and in                            the  sigmoid colon.                           - Normal mucosa in the entire examined colon.                           - Non-bleeding non-thrombosed external and internal                            hemorrhoids. Recommendation:           - The patient will be observed post-procedure,                            until all discharge criteria are met.                           - Discharge patient to home.                           - Patient has a contact number available for  emergencies. The signs and symptoms of potential                            delayed complications were discussed with the                            patient. Return to normal activities tomorrow.                            Written discharge instructions were provided to the                            patient.                           - High fiber diet.                           - Use FiberCon 1-2 tablets PO daily.                           - Continue present medications.                           - Await pathology results.                           - Repeat colonoscopy in 10 years for screening                            purposes.                           - The findings and recommendations were discussed                            with the patient.                           - The findings and recommendations were discussed                            with the patient's family. Aloha Finner, MD 08/19/2023 3:25:00 PM

## 2023-08-19 NOTE — Op Note (Signed)
 [facility] Patient Name: Olivia Page Procedure Date: 08/19/2023 2:49 PM MRN: [id number] Endoscopist: Aloha Finner , MD, [id number] Age: 58 Referring MD:  Date of Birth: 03-29-1966 Gender: Female Account #: [id number] Procedure:                Upper GI endoscopy Indications:              Heartburn, Screening for Barrett's esophagus in                            patient at risk for this condition Medicines:                Monitored Anesthesia Care Procedure:                Pre-Anesthesia Assessment:                           - Prior to the procedure, a History and Physical                            was performed, and patient medications and                            allergies were reviewed. The patient's tolerance of                            previous anesthesia was also reviewed. The risks                            and benefits of the procedure and the sedation                            options and risks were discussed with the patient.                            All questions were answered, and informed consent                            was obtained. Prior Anticoagulants: The patient has                            taken no anticoagulant or antiplatelet agents                            except for NSAID medication. ASA Grade Assessment:                            II - A patient with mild systemic disease. After                            reviewing the risks and benefits, the patient was                            deemed in satisfactory condition to undergo the  procedure.                           After obtaining informed consent, the endoscope was                            passed under direct vision. Throughout the                            procedure, the patient's blood pressure, pulse, and                            oxygen saturations were monitored continuously. The                            Olympus Scope SN [id number] was  introduced through the                            mouth, and advanced to the second part of duodenum.                            The upper GI endoscopy was accomplished without                            difficulty. The patient tolerated the procedure. Scope In: Scope Out: Findings:                 No gross lesions were noted in the proximal                            esophagus and in the mid esophagus.                           One tongue of salmon-colored mucosa was present                            from 35.5 to 37 cm. No other visible abnormalities                            were present. The maximum longitudinal extent of                            these esophageal mucosal changes was 1 cm in                            length. Biopsies were taken with a cold forceps for                            histology.                           LA Grade A (one or more mucosal breaks less than 5  mm, not extending between tops of 2 mucosal folds)                            esophagitis with no bleeding was found in the                            distal esophagus.                           A 1 cm hiatal hernia was present.                           Patchy mildly erythematous mucosa without bleeding                            was found in the stomach. Biopsies were taken with                            a cold forceps for histology and Helicobacter                            pylori testing.                           No gross lesions were noted in the duodenal bulb,                            in the first portion of the duodenum and in the                            second portion of the duodenum. Complications:            No immediate complications. Estimated Blood Loss:     Estimated blood loss was minimal. Impression:               - No gross lesions in the proximal esophagus and in                            the mid esophagus.                           - Salmon-colored  mucosal tongue suspicious for                            short-segment Barrett's esophagus found distally.                            Biopsied.                           - LA Grade A esophagitis with no bleeding found                            distally.                           -  1 cm hiatal hernia.                           - Erythematous mucosa in the stomach. Biopsied.                           - No gross lesions in the duodenal bulb, in the                            first portion of the duodenum and in the second                            portion of the duodenum. Recommendation:           - Proceed to scheduled colonoscopy.                           - Observe patient's clinical course.                           - Await pathology results.                           - Repeat upper endoscopy for surveillance based on                            pathology results, if evidence of Barrett's                            esophagus is found.                           - Recommend daily PPI at least 20 mg daily to heal                            esophagitis. Based on patient's intermittent                            symptoms, could consider coming off (pending she                            does not evidence of Barrett's esophagus).                           -Repeat upper endoscopy sooner, may not be required                            with GIST grade a esophagitis, but could be                            considered and discussed in future.                           - The findings and recommendations were discussed  with the patient.                           - The findings and recommendations were discussed                            with the patient's family. Aloha Finner, MD 08/19/2023 3:22:37 PM

## 2023-08-19 NOTE — Telephone Encounter (Addendum)
 Copied from CRM [id number]. Topic: Clinical - Medical Advice >> Aug 19, 2023  9:28 AM Adaysia C wrote: Reason for CRM: Patient is scheduled to have surgery later on today after 2:00p and she called to ask a nurse if it is ok for her to drink Cocola after her prep drink because a nurse prior told her it was ok to drink cocola because they considered it a clear fluid and could be consumed before surgery. Please follow up with patient [phone]   0948-Called patient back to discuss, no answer.   10:21- 2nd attempt, no answer, left vmail

## 2023-08-19 NOTE — Patient Instructions (Addendum)
 Resume previous diet and medications. Follow a high fiber diet. Take Fibercon 1-2 tablets daily. Repeat Colonoscopy in 10 years for screening purposes. Awaiting pathology results.  Repeat Colonoscopy date to be determined based on pathology results. Recommend daily Nexium  40 mg daily to heal esophagitis. Based on patients intermittent symptoms.   YOU HAD AN ENDOSCOPIC PROCEDURE TODAY AT THE [facility]:   Refer to the procedure report that was given to you for any specific questions about what was found during the examination.  If the procedure report does not answer your questions, please call your gastroenterologist to clarify.  If you requested that your care partner not be given the details of your procedure findings, then the procedure report has been included in a sealed envelope for you to review at your convenience later.  YOU SHOULD EXPECT: Some feelings of bloating in the abdomen. Passage of more gas than usual.  Walking can help get rid of the air that was put into your GI tract during the procedure and reduce the bloating. If you had a lower endoscopy (such as a colonoscopy or flexible sigmoidoscopy) you may notice spotting of blood in your stool or on the toilet paper. If you underwent a bowel prep for your procedure, you may not have a normal bowel movement for a few days.  Please Note:  You might notice some irritation and congestion in your nose or some drainage.  This is from the oxygen used during your procedure.  There is no need for concern and it should clear up in a day or so.  SYMPTOMS TO REPORT IMMEDIATELY:  Following lower endoscopy (colonoscopy or flexible sigmoidoscopy):  Excessive amounts of blood in the stool  Significant tenderness or worsening of abdominal pains  Swelling of the abdomen that is new, acute  Fever of 100F or higher  Following upper endoscopy (EGD)  Vomiting of blood or coffee ground material  New chest pain or pain under the shoulder  blades  Painful or persistently difficult swallowing  New shortness of breath  Fever of 100F or higher  Black, tarry-looking stools  For urgent or emergent issues, a gastroenterologist can be reached at any hour by calling [phone]. Do not use MyChart messaging for urgent concerns.    DIET:  We do recommend a small meal at first, but then you may proceed to your regular diet.  Drink plenty of fluids but you should avoid alcoholic beverages for 24 hours.  ACTIVITY:  You should plan to take it easy for the rest of today and you should NOT DRIVE or use heavy machinery until tomorrow (because of the sedation medicines used during the test).    FOLLOW UP: Our staff will call the number listed on your records the next business day following your procedure.  We will call around 7:15- 8:00 am to check on you and address any questions or concerns that you may have regarding the information given to you following your procedure. If we do not reach you, we will leave a message.     If any biopsies were taken you will be contacted by phone or by letter within the next 1-3 weeks.  Please call us  at [phone] if you have not heard about the biopsies in 3 weeks.    SIGNATURES/CONFIDENTIALITY: You and/or your care partner have signed paperwork which will be entered into your electronic medical record.  These signatures attest to the fact that that the information above on your After Visit Summary  has been reviewed and is understood.  Full responsibility of the confidentiality of this discharge information lies with you and/or your care-partner.

## 2023-08-19 NOTE — Progress Notes (Signed)
 Sedate, gd SR, tolerated procedure well, VSS, report to RN

## 2023-08-22 ENCOUNTER — Telehealth: Payer: Self-pay | Admitting: *Deleted

## 2023-08-22 NOTE — Telephone Encounter (Signed)
 Called patient to follow up after 08/18/22 colonoscopy. Left voicemail with callback number for any questions or concerns.

## 2023-08-24 LAB — SURGICAL PATHOLOGY

## 2023-08-25 ENCOUNTER — Telehealth: Payer: Self-pay | Admitting: Gastroenterology

## 2023-08-25 NOTE — Telephone Encounter (Signed)
 The pt has been advised that Dr Meridee Score has not reviewed the pathology as of today and we will call her as soon as able. The pt has been advised of the information and verbalized understanding.

## 2023-08-25 NOTE — Telephone Encounter (Signed)
 PT received pathology results and would like to discuss them.  Please advise.

## 2023-08-26 NOTE — Telephone Encounter (Signed)
 The pt has been advised that the pathology has not been reviewed as of this morning. I did let her know that we will reach out to her as soon as able.  The pt has been advised of the information and verbalized understanding.

## 2023-08-26 NOTE — Telephone Encounter (Signed)
 Patient called and stated she would like a phone call today regarding her results from her recent procedure. Patient stated she would like a call back at either her work or her mobile number. Patient work number is [phone] and her mobile number is [phone]. Please advise.

## 2023-08-27 ENCOUNTER — Encounter: Payer: Self-pay | Admitting: Gastroenterology

## 2023-11-01 ENCOUNTER — Encounter: Payer: Self-pay | Admitting: Gastroenterology

## 2023-12-27 ENCOUNTER — Other Ambulatory Visit: Payer: Self-pay | Admitting: Obstetrics and Gynecology

## 2023-12-27 DIAGNOSIS — Z1231 Encounter for screening mammogram for malignant neoplasm of breast: Secondary | ICD-10-CM

## 2024-01-04 ENCOUNTER — Ambulatory Visit
Admission: RE | Admit: 2024-01-04 | Discharge: 2024-01-04 | Disposition: A | Discharge status: Home / Self Care | Source: Ambulatory Visit | Attending: Obstetrics and Gynecology | Admitting: Obstetrics and Gynecology

## 2024-01-04 DIAGNOSIS — Z1231 Encounter for screening mammogram for malignant neoplasm of breast: Secondary | ICD-10-CM

## 2024-02-07 ENCOUNTER — Ambulatory Visit: Admitting: Family Medicine

## 2024-03-26 ENCOUNTER — Ambulatory Visit: Payer: Self-pay

## 2024-03-26 NOTE — Telephone Encounter (Addendum)
 Attempted to contact pt 3x, no contact made. LVMTCB.  Patient is experiencing neck pain on the left side, patient has no thyroid  and is currently taking thyroid  replacement medication. She has not seen any provider at the office yet but has a new patient appointment with Corean Ku on 04/05/2024.

## 2024-03-26 NOTE — Telephone Encounter (Signed)
 FYI Only or Action Required?: FYI only for provider.  Patient has a new patient appointment scheduled next week  Called Nurse Triage reporting Neck Pain.  Symptoms began several days ago.  Interventions attempted: Rest, hydration, or home remedies.  Symptoms are: unchanged.  Triage Disposition: See PCP When Office is Open (Within 3 Days), No Contact Calls-recommended to the Emergency Department  Patient/caregiver understands and will follow disposition?: No, wishes to speak with PCP  Reason for Disposition  [1] MODERATE neck pain (e.g., interferes with normal activities) AND [2] present > 3 days  Answer Assessment - Initial Assessment Questions 1. ONSET: When did the pain begin?      Started several days ago 2. LOCATION: Where does it hurt?      Pain to left side of neck 3. PATTERN Does the pain come and go, or has it been constant since it started?      Comes and goes 4. SEVERITY: How bad is the pain?  (Scale 0-10; or none or slight stiffness, mild, moderate, severe)     6 out of 10 5. RADIATION: Does the pain go anywhere else, shoot into your arms?     no 6. CORD SYMPTOMS: Any weakness or numbness of the arms or legs?     no 7. CAUSE: What do you think is causing the neck pain?     Unsure-patient endorses she is under a good amount of stress 8. NECK OVERUSE: Any recent activities that involved turning or twisting the neck?     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 OTHER SYMPTOMS: Do you have any other symptoms? (e.g., headache, fever, chest pain, difficulty breathing, neck swelling)     Patient reports increased stress.  Protocols used: Neck Pain or Stiffness-A-AH

## 2024-03-31 ENCOUNTER — Emergency Department (HOSPITAL_COMMUNITY)
Admission: EM | Admit: 2024-03-31 | Discharge: 2024-03-31 | Disposition: A | Discharge status: Home / Self Care | Attending: Emergency Medicine | Admitting: Emergency Medicine

## 2024-03-31 ENCOUNTER — Emergency Department (HOSPITAL_COMMUNITY)

## 2024-03-31 ENCOUNTER — Encounter (HOSPITAL_COMMUNITY): Payer: Self-pay | Admitting: Pharmacy Technician

## 2024-03-31 ENCOUNTER — Other Ambulatory Visit: Payer: Self-pay

## 2024-03-31 DIAGNOSIS — R519 Headache, unspecified: Secondary | ICD-10-CM | POA: Insufficient documentation

## 2024-03-31 DIAGNOSIS — M542 Cervicalgia: Secondary | ICD-10-CM | POA: Insufficient documentation

## 2024-03-31 LAB — BASIC METABOLIC PANEL WITH GFR
Anion gap: 9 (ref 5–15)
BUN: 11 mg/dL (ref 6–20)
CO2: 25 mmol/L (ref 22–32)
Calcium: 9.1 mg/dL (ref 8.9–10.3)
Chloride: 106 mmol/L (ref 98–111)
Creatinine, Ser: 0.65 mg/dL (ref 0.44–1.00)
GFR, Estimated: >60 mL/min (ref >60)
Glucose, Bld: 89 mg/dL (ref 70–99)
Potassium: 4.5 mmol/L (ref 3.5–5.1)
Sodium: 140 mmol/L (ref 135–145)

## 2024-03-31 LAB — CBC
HCT: 42 % (ref 36.0–46.0)
Hemoglobin: 13.4 g/dL (ref 12.0–15.0)
MCH: 27.8 pg (ref 26.0–34.0)
MCHC: 31.9 g/dL (ref 30.0–36.0)
MCV: 87.1 fL (ref 80.0–100.0)
Platelets: 228 K/uL (ref 150–400)
RBC: 4.82 MIL/uL (ref 3.87–5.11)
RDW: 12.8 % (ref 11.5–15.5)
WBC: 5.6 K/uL (ref 4.0–10.5)
nRBC: 0 % (ref 0.0–0.2)

## 2024-03-31 MED ORDER — MAGNESIUM SULFATE 2 GM/50ML IV SOLN
2.0000 g | Freq: Once | INTRAVENOUS | Status: AC
  Administered 2024-03-31: 2 g via INTRAVENOUS
  Filled 2024-03-31: qty 50

## 2024-03-31 MED ORDER — DIPHENHYDRAMINE HCL 50 MG/ML IJ SOLN
25.0000 mg | Freq: Once | INTRAMUSCULAR | Status: AC
  Administered 2024-03-31: 25 mg via INTRAVENOUS
  Filled 2024-03-31: qty 1

## 2024-03-31 MED ORDER — ACETAMINOPHEN 500 MG PO TABS
1000.0000 mg | ORAL_TABLET | Freq: Once | ORAL | Status: AC
  Administered 2024-03-31: 1000 mg via ORAL
  Filled 2024-03-31: qty 2

## 2024-03-31 MED ORDER — LACTATED RINGERS IV BOLUS
1000.0000 mL | Freq: Once | INTRAVENOUS | Status: AC
  Administered 2024-03-31: 1000 mL via INTRAVENOUS

## 2024-03-31 MED ORDER — DEXAMETHASONE SODIUM PHOSPHATE 10 MG/ML IJ SOLN
10.0000 mg | Freq: Once | INTRAMUSCULAR | Status: AC
  Administered 2024-03-31: 10 mg via INTRAVENOUS
  Filled 2024-03-31: qty 1

## 2024-03-31 MED ORDER — IOHEXOL 350 MG/ML SOLN
80.0000 mL | Freq: Once | INTRAVENOUS | Status: AC | PRN
  Administered 2024-03-31: 80 mL via INTRAVENOUS

## 2024-03-31 MED ORDER — KETOROLAC TROMETHAMINE 15 MG/ML IJ SOLN
15.0000 mg | Freq: Once | INTRAMUSCULAR | Status: AC
  Administered 2024-03-31: 15 mg via INTRAVENOUS
  Filled 2024-03-31: qty 1

## 2024-03-31 MED ORDER — PROCHLORPERAZINE EDISYLATE 10 MG/2ML IJ SOLN
10.0000 mg | Freq: Once | INTRAMUSCULAR | Status: AC
  Administered 2024-03-31: 10 mg via INTRAVENOUS
  Filled 2024-03-31: qty 2

## 2024-03-31 NOTE — ED Notes (Signed)
 Save gold tube in main lab

## 2024-03-31 NOTE — ED Triage Notes (Signed)
 Pt here with reports of migraine since yesterday along with neck pain for the last week. Took ibuprofen without relief.

## 2024-03-31 NOTE — ED Provider Notes (Signed)
 [place] EMERGENCY DEPARTMENT AT [facility] Provider Note   CSN: [id number] Arrival date & time: 03/31/24  1041     History  Chief Complaint  Patient presents with   Headache    Olivia Page is a 58 y.o. female with PMH as listed below who presents with migraine since yesterday along with neck pain on the left side that started yesterday. Feels like her normal migraine except the neck pain.  Reports that the headache is 7 out of 10, located in her front of her head and bilaterally, throbbing in nature.  Headache is not positional.  Reports that the headache did start suddenly maximum intensity yesterday as she was just resting.  Endorses nausea/vomiting which she always has with migraine as well as photophobia. The neck pain is different than her normal migraine.   Also started around the same time and it is difficult to turn her head.  She denies any numbness tingling, asymmetric weakness, visual changes, slurred speech, falls, head trauma. Took ibuprofen, alprazolam without relief. Has h/o thyroidectomy and takes levothyroxine  as well.  No household members with similar headache.   Past Medical History:  Diagnosis Date   Anxiety    Blood transfusion without reported diagnosis    Hypertension    Migraine headache with aura    Osteopenia    Post-operative nausea and vomiting    Thyroid  disease        Home Medications Prior to Admission medications   Medication Sig Start Date End Date Taking? Authorizing Provider  ALPRAZolam (XANAX) 0.5 MG tablet Take 0.5 mg by mouth at bedtime as needed.    [provider]  esomeprazole  (NEXIUM ) 40 MG capsule Take 1 capsule (40 mg total) by mouth daily at 12 noon. 08/19/23   Mansouraty, Gabriel Jr., MD  ibuprofen (ADVIL) 800 MG tablet Take 800 mg by mouth every 8 (eight) hours as needed for moderate pain (pain score 4-6). 05/27/23   [provider]  levothyroxine  (SYNTHROID ) 100 MCG tablet Take 100 mcg by mouth  once a week. Once a week on Sundays    [provider]  levothyroxine  (SYNTHROID ) 112 MCG tablet Take 112 mcg by mouth every morning. 08/01/23   [provider]  lisinopril (PRINIVIL,ZESTRIL) 10 MG tablet Take 10 mg by mouth daily. 08/13/14   [provider]  Multiple Vitamin (MULTIVITAMIN) tablet Take 1 tablet by mouth daily.    [provider]  Omeprazole  Magnesium  (PRILOSEC PO) Take 1 mg by mouth as needed.    [provider]      Allergies    Codeine and Sulfa antibiotics    Review of Systems   Review of Systems A 10 point review of systems was performed and is negative unless otherwise reported in HPI.  Physical Exam Updated Vital Signs BP 120/76 (BP Location: Left Arm)   Pulse 72   Temp (!) 97.3 F (36.3 C) (Axillary)   Resp 18   SpO2 100%  Physical Exam General: Normal appearing female, lying in bed.  HEENT: NCAT, EOMI, PERRLA 3mm, Sclera anicteric, MMM, trachea midline. Tongue protrudes midline. Clear oropharynx. Normal appearing neck without any visible or palpable masses. Normal carotid pulses with no bruits. No cervical lymphadenopathy. TTP in left-sided neck musculature. Cardiology: RRR, no murmurs/rubs/gallops. Resp: Normal respiratory rate and effort. CTAB, no wheezes, rhonchi, crackles.  Abd: Soft, non-tender, non-distended. No rebound tenderness or guarding.  GU: Deferred. MSK: No peripheral edema or signs of trauma. Extremities without deformity or TTP.  No cyanosis or clubbing. Skin: warm, dry.  Neuro: A&Ox4, CNs II-XII grossly intact. 5/5 strength all extremities. Sensation grossly intact.  Psych: Normal mood and affect.   ED Results / Procedures / Treatments   Labs (all labs ordered are listed, but only abnormal results are displayed) Labs Reviewed  BASIC METABOLIC PANEL WITH GFR  CBC    EKG None  Radiology CT ANGIO HEAD NECK W WO CM Result Date: 03/31/2024 CLINICAL DATA:  Headache and neck pain. EXAM: CT  ANGIOGRAPHY HEAD AND NECK WITH AND WITHOUT CONTRAST TECHNIQUE: Multidetector CT imaging of the head and neck was performed using the standard protocol during bolus administration of intravenous contrast. Multiplanar CT image reconstructions and MIPs were obtained to evaluate the vascular anatomy. Carotid stenosis measurements (when applicable) are obtained utilizing NASCET criteria, using the distal internal carotid diameter as the denominator. RADIATION DOSE REDUCTION: This exam was performed according to the departmental dose-optimization program which includes automated exposure control, adjustment of the mA and/or kV according to patient size and/or use of iterative reconstruction technique. CONTRAST:  80mL OMNIPAQUE  IOHEXOL  350 MG/ML SOLN COMPARISON:  None Available. FINDINGS: CT HEAD FINDINGS Brain: There is no evidence of an acute infarct, intracranial hemorrhage, mass, midline shift, or extra-axial fluid collection. Cerebral volume is normal. The ventricles are normal in size. Vascular: No hyperdense vessel. Skull: No fracture or suspicious lesion. Sinuses/Orbits: Paranasal sinuses and mastoid air cells are clear. Unremarkable orbits. Other: None. Review of the MIP images confirms the above findings CTA NECK FINDINGS Aortic arch: Normal variant aortic arch branching pattern with common origin of the brachiocephalic and left common carotid arteries. Widely patent arch vessel origins. Right carotid system: Patent without evidence of stenosis or dissection. Left carotid system: Patent without evidence of stenosis or dissection. Vertebral arteries: Patent without evidence of stenosis or dissection. Strongly dominant left vertebral artery. Skeleton: Focal, moderate disc degeneration at C5-6. Moderate facet arthrosis on the left at C2-3 and on the right at C3-4. Other neck: Thyroidectomy.  No evidence of cervical lymphadenopathy. Upper chest: Clear lung apices. Review of the MIP images confirms the above findings  CTA HEAD FINDINGS Anterior circulation: The internal carotid arteries are widely patent from skull base to carotid termini. ACAs and MCAs are patent without evidence of a proximal branch occlusion or significant proximal stenosis. No aneurysm is identified. Posterior circulation: The intracranial vertebral arteries are widely patent to the basilar with the right being particularly hypoplastic distal to the PICA origin. The basilar artery is widely patent. There are large posterior communicating arteries bilaterally with hypoplasia of the P1 segments. Both PCAs are patent without evidence of a significant proximal stenosis. No aneurysm is identified. Venous sinuses: Patent. Anatomic variants: Strongly dominant left vertebral artery. Review of the MIP images confirms the above findings IMPRESSION: 1. Unremarkable noncontrast CT appearance of the brain. No evidence of acute intracranial abnormality. 2. No large vessel occlusion, significant stenosis, aneurysm, or dissection in the head or neck. Electronically Signed   By: Dasie Hamburg M.D.   On: 03/31/2024 15:12    Procedures Procedures    Medications Ordered in ED Medications  prochlorperazine  (COMPAZINE ) injection 10 mg (10 mg Intravenous Given 03/31/24 1407)  diphenhydrAMINE  (BENADRYL ) injection 25 mg (25 mg Intravenous Given 03/31/24 1408)  lactated ringers  bolus 1,000 mL (1,000 mLs Intravenous New Bag/Given 03/31/24 1408)  magnesium  sulfate IVPB 2 g 50 mL (0 g Intravenous Stopped 03/31/24 1509)  dexamethasone  (DECADRON ) injection 10 mg (10 mg Intravenous Given 03/31/24 1407)  iohexol  (OMNIPAQUE )  350 MG/ML injection 80 mL (80 mLs Intravenous Contrast Given 03/31/24 1455)  acetaminophen  (TYLENOL ) tablet 1,000 mg (1,000 mg Oral Given 03/31/24 1527)  ketorolac  (TORADOL ) 15 MG/ML injection 15 mg (15 mg Intravenous Given 03/31/24 1527)    ED Course/ Medical Decision Making/ A&P                          Medical Decision Making Amount and/or Complexity of  Data Reviewed Labs: ordered. Radiology: ordered. Decision-making details documented in ED Course.  Risk OTC drugs. Prescription drug management.    This patient presents to the ED for concern of headache, left-neck pain, this involves an extensive number of treatment options, and is a complaint that carries with it a high risk of complications and morbidity.  I considered the following differential and admission for this acute, potentially life threatening condition.   MDM:    Patient is overall very well-appearing, HDS, non-toxic, afebrile.   Must consider SAH: she reports that the headache was thunderclap, but also that it feels like her normal migraine. She has no neurologic symptoms but also reports that left-sided neck pain is new and severe. She does seem to have TTP in her musculature in the neck but will obtain CTA H&N to r/o [facility] as well as vascular abnormality such as carotid artery dissection.   Unlikely Subdural/epidural hematoma: no history of trauma, no anticoagulation. Unlikely Meningitis: afebrile, no meningismus. Unlikely Temporal arteritis: pt < 6 years old.  no tenderness in temporal area Unlikely Acute angle glaucoma: PERRLA, no eye pain. Unlikely Carbon Monoxide Poisoning: no other house members with similar symptoms. Unlikely IIH: Headache is not positional and not associated with any visual changes  Plan: Will give headache cocktail and reexamine.  Most likely 2/2 tension headache, migraine, or headache of non-emergent etiology.  Patient's neck pain is likely muscular in etiology.    Clinical Course as of 03/31/24 1632  Sat Mar 31, 2024  1309 BMP, CBC wnl [HN]  1516 CT ANGIO HEAD NECK W WO CM 1. Unremarkable noncontrast CT appearance of the brain. No evidence of acute intracranial abnormality. 2. No large vessel occlusion, significant stenosis, aneurysm, or dissection in the head or neck.   [HN]  1631 Patient reevaluated and she feels much improved.   Headache now 3 out of 10.  Neck pain is gone.  She is very reassured that her CT angiogram is negative.  Very low concern for any acute or life-threatening pathology.  Patient feels much better is requesting to be discharged.  She has tolerated p.o. here in the department.  Patient states she already has follow-up on Friday with her new primary care physician.  Given patient specific discharge instructions and return precautions, all questions answered to patient satisfaction. [HN]    Clinical Course User Index [HN] Franklyn Sid SAILOR, MD    Labs: Ordered from triage, and I personally interpreted labs.  The pertinent results include:  those listed above  Imaging Studies ordered: I ordered imaging studies including CTA H&N I independently visualized and interpreted imaging. I agree with the radiologist interpretation  Additional history obtained from chart review.    Reevaluation: After the interventions noted above, I reevaluated the patient and found that they have :improved  Social Determinants of Health: Lives independently  Disposition:  DC w/ discharge instructions/return precautions. All questions answered to patient's satisfaction.    Co morbidities that complicate the patient evaluation  Past Medical History:  Diagnosis Date  Anxiety    Blood transfusion without reported diagnosis    Hypertension    Migraine headache with aura    Osteopenia    Post-operative nausea and vomiting    Thyroid  disease      Medicines Meds ordered this encounter  Medications   prochlorperazine  (COMPAZINE ) injection 10 mg   diphenhydrAMINE  (BENADRYL ) injection 25 mg   lactated ringers  bolus 1,000 mL   magnesium  sulfate IVPB 2 g 50 mL   dexamethasone  (DECADRON ) injection 10 mg   iohexol  (OMNIPAQUE ) 350 MG/ML injection 80 mL   acetaminophen  (TYLENOL ) tablet 1,000 mg   ketorolac  (TORADOL ) 15 MG/ML injection 15 mg    I have reviewed the patients home medicines and have made adjustments as  needed  Problem List / ED Course: Problem List Items Addressed This Visit   None Visit Diagnoses       Bad headache    -  Primary   Relevant Medications   acetaminophen  (TYLENOL ) tablet 1,000 mg (Completed)   ketorolac  (TORADOL ) 15 MG/ML injection 15 mg (Completed)     Neck pain on left side                       This note was created using dictation software, which may contain spelling or grammatical errors.    Franklyn Sid SAILOR, MD 03/31/24 [phone]

## 2024-03-31 NOTE — Discharge Instructions (Signed)
 Thank you for coming to [facility] Emergency Department. You were seen for headache and neck pain.  CT angiogram of the head and neck did not demonstrate any abnormalities.  You improved after medications in the ER.  Please stay well-hydrated at home.  Please follow-up with your primary care physician on Friday as originally scheduled.  Do not hesitate to return to the ED or call 911 if you experience: -Worsening symptoms -Acute onset worsening headache or neck pain -Numbness tingling, asymmetric weakness, visual changes, slurred speech, facial droop, confusion -Nausea vomiting so severe cannot eat or drink anything -Lightheadedness, passing out -Fevers/chills -Anything else that concerns you

## 2024-04-05 ENCOUNTER — Ambulatory Visit: Admitting: Family Medicine

## 2024-07-17 NOTE — Progress Notes (Unsigned)
 New Patient Visit  Subjective:     Patient ID: Olivia Page, female    DOB: [birth year], 58 y.o.   MRN: [id number]  No chief complaint on file.   HPI  Discussed the use of AI scribe software for clinical note transcription with the patient, who gave verbal consent to proceed.  History of Present Illness      ROS Per HPI  Outpatient Encounter Medications as of 07/19/2024  Medication Sig   ALPRAZolam (XANAX) 0.5 MG tablet Take 0.5 mg by mouth at bedtime as needed.   esomeprazole  (NEXIUM ) 40 MG capsule Take 1 capsule (40 mg total) by mouth daily at 12 noon.   ibuprofen (ADVIL) 800 MG tablet Take 800 mg by mouth every 8 (eight) hours as needed for moderate pain (pain score 4-6).   levothyroxine  (SYNTHROID ) 100 MCG tablet Take 100 mcg by mouth once a week. Once a week on Sundays   levothyroxine  (SYNTHROID ) 112 MCG tablet Take 112 mcg by mouth every morning.   lisinopril (PRINIVIL,ZESTRIL) 10 MG tablet Take 10 mg by mouth daily.   Multiple Vitamin (MULTIVITAMIN) tablet Take 1 tablet by mouth daily.   Omeprazole  Magnesium  (PRILOSEC PO) Take 1 mg by mouth as needed.   No facility-administered encounter medications on file as of 07/19/2024.    Past Medical History:  Diagnosis Date   Anxiety    Blood transfusion without reported diagnosis    Hypertension    Migraine headache with aura    Osteopenia    Post-operative nausea and vomiting    Thyroid  disease     Past Surgical History:  Procedure Laterality Date   ABDOMINAL HYSTERECTOMY     BREAST LUMPECTOMY Right 1998   benign lump removed    BREAST SURGERY     KNEE SURGERY Left 02/11/2021   REDUCTION MAMMAPLASTY Bilateral    TOTAL THYROIDECTOMY  2011    Family History  Problem Relation Age of Onset   Hypertension Mother    Diverticulitis Mother    Stroke Father 85   Alcohol abuse Brother        in recovery   Cancer Maternal Grandmother    Cancer Maternal Grandfather    Stomach cancer Paternal Grandmother     Cancer Paternal Grandmother    Cancer Paternal Grandfather    Breast cancer Neg Hx    Colon cancer Neg Hx    Esophageal cancer Neg Hx    Rectal cancer Neg Hx    Colon polyps Neg Hx     Social History   Socioeconomic History   Marital status: Married    Spouse name: Velinda Ash   Number of children: 2   Years of education: Not on file   Highest education level: Not on file  Occupational History   Occupation: airline pilot    Comment: [employer]  Tobacco Use   Smoking status: Never   Smokeless tobacco: Never  Substance and Sexual Activity   Alcohol use: Yes    Comment: seldom   Drug use: No   Sexual activity: Never    Birth control/protection: Abstinence  Other Topics Concern   Not on file  Social History Narrative   Lives with her husband and their two children.   Social Drivers of Corporate Investment Banker Strain: Not on file  Food Insecurity: Not on file  Transportation Needs: Not on file  Physical Activity: Not on file  Stress: Not on file  Social Connections: Not on file  Intimate Partner Violence:  Not on file       Objective:    There were no vitals taken for this visit.   Physical Exam Vitals and nursing note reviewed.  Constitutional:      General: She is not in acute distress.    Appearance: Normal appearance. She is normal weight.  HENT:     Head: Normocephalic and atraumatic.     Right Ear: External ear normal.     Left Ear: External ear normal.     Nose: Nose normal.     Mouth/Throat:     Mouth: Mucous membranes are moist.     Pharynx: Oropharynx is clear.  Eyes:     Extraocular Movements: Extraocular movements intact.     Pupils: Pupils are equal, round, and reactive to light.  Cardiovascular:     Rate and Rhythm: Normal rate and regular rhythm.     Pulses: Normal pulses.     Heart sounds: Normal heart sounds.  Pulmonary:     Effort: Pulmonary effort is normal. No respiratory distress.     Breath sounds: Normal breath sounds. No  wheezing, rhonchi or rales.  Musculoskeletal:        General: Normal range of motion.     Cervical back: Normal range of motion.     Right lower leg: No edema.     Left lower leg: No edema.  Lymphadenopathy:     Cervical: No cervical adenopathy.  Neurological:     General: No focal deficit present.     Mental Status: She is alert and oriented to person, place, and time.  Psychiatric:        Mood and Affect: Mood normal.        Thought Content: Thought content normal.     No results found for any visits on 07/19/24.      Assessment & Plan:   Assessment and Plan Assessment & Plan      No orders of the defined types were placed in this encounter.    No orders of the defined types were placed in this encounter.   No follow-ups on file.  Corean LITTIE Ku, FNP

## 2024-07-17 NOTE — Patient Instructions (Incomplete)

## 2024-07-19 ENCOUNTER — Ambulatory Visit: Admitting: Family Medicine

## 2024-07-19 ENCOUNTER — Encounter: Payer: Self-pay | Admitting: Family Medicine

## 2024-07-19 VITALS — BP 122/90 | HR 81 | Temp 98.0°F | Ht 64.45 in | Wt 162.0 lb

## 2024-07-19 DIAGNOSIS — E890 Postprocedural hypothyroidism: Secondary | ICD-10-CM

## 2024-07-19 DIAGNOSIS — Z9089 Acquired absence of other organs: Secondary | ICD-10-CM

## 2024-07-19 DIAGNOSIS — G43009 Migraine without aura, not intractable, without status migrainosus: Secondary | ICD-10-CM

## 2024-07-19 DIAGNOSIS — M8580 Other specified disorders of bone density and structure, unspecified site: Secondary | ICD-10-CM

## 2024-07-19 DIAGNOSIS — J069 Acute upper respiratory infection, unspecified: Secondary | ICD-10-CM

## 2024-07-19 DIAGNOSIS — Z7689 Persons encountering health services in other specified circumstances: Secondary | ICD-10-CM

## 2024-07-19 DIAGNOSIS — Z85850 Personal history of malignant neoplasm of thyroid: Secondary | ICD-10-CM

## 2024-07-19 DIAGNOSIS — Z9889 Other specified postprocedural states: Secondary | ICD-10-CM

## 2024-07-19 DIAGNOSIS — B9689 Other specified bacterial agents as the cause of diseases classified elsewhere: Secondary | ICD-10-CM

## 2024-07-19 DIAGNOSIS — K219 Gastro-esophageal reflux disease without esophagitis: Secondary | ICD-10-CM

## 2024-07-19 LAB — POCT INFLUENZA A/B
Influenza A, POC: NEGATIVE
Influenza B, POC: NEGATIVE

## 2024-07-19 LAB — POC COVID19 BINAXNOW: SARS Coronavirus 2 Ag: NEGATIVE

## 2024-07-20 ENCOUNTER — Ambulatory Visit: Payer: Self-pay

## 2024-07-20 MED ORDER — AZITHROMYCIN 250 MG PO TABS: ORAL_TABLET | ORAL | 0 refills | Status: AC

## 2024-07-20 NOTE — Telephone Encounter (Signed)
 PCP sent in medication, patient notified

## 2024-07-20 NOTE — Telephone Encounter (Signed)
 FYI Only or Action Required?: Action required by provider: request for appointment and clinical question for provider.  Patient was last seen in primary care on 07/19/2024 by Alvia Corean CROME, FNP.  Called Nurse Triage reporting Headache and Sinusitis.  Symptoms began several days ago.  Interventions attempted: OTC medications: Saline Rinses.  Symptoms are: gradually worsening.  Triage Disposition: See PCP When Office is Open (Within 3 Days)  Patient/caregiver understands and will follow disposition?: Yes   Copied from CRM [id number]. Topic: Clinical - Red Word Triage >> Jul 20, 2024  9:30 AM Wess RAMAN wrote: Red Word that prompted transfer to Nurse Triage: Major headaches, sinus pressure, facial pain, congestion for several days. Would like medication called in.  Pharmacy: [id number] - [place], [place] - [address] AT [facility] OF [facility] [address] [place] [address] Phone: [phone] Fax: [phone] Reason for Disposition  [1] Sinus congestion (pressure, fullness) AND [2] present > 10 days  Answer Assessment - Initial Assessment Questions 1. LOCATION: Where does it hurt?     Pressure, Frontal  2. ONSET: When did the sinus pain start?  (e.g., hours, days)      Several Days  3. SEVERITY: How bad is the pain?   (Scale 0-10; or none, mild, moderate or severe)     Moderate   4. RECURRENT SYMPTOM: Have you ever had sinus problems before? If Yes, ask: When was the last time? and What happened that time?      Yes  5. NASAL CONGESTION: Is the nose blocked? If Yes, ask: Can you open it or must you breathe through your mouth?     Congestion  6. NASAL DISCHARGE: Do you have discharge from your nose? If so ask, What color?      Yellow to Green  7. FEVER: Do you have a fever? If Yes, ask: What is it, how was it measured, and when did it start?      No  8. OTHER SYMPTOMS: Do you have any other  symptoms? (e.g., sore throat, cough, earache, difficulty breathing)     Chills, Tinnitus  9. PREGNANCY: Is there any chance you are pregnant? When was your last menstrual period?     No and No  Protocols used: Sinus Pain or Congestion-A-AH

## 2024-07-20 NOTE — Addendum Note (Signed)
 Addended by: Angela Vazguez L on: 07/20/2024 09:46 AM   Modules accepted: Orders

## 2024-07-27 ENCOUNTER — Ambulatory Visit: Payer: Self-pay

## 2024-07-27 NOTE — Telephone Encounter (Signed)
 3rd attempt. Left message.

## 2024-07-27 NOTE — Telephone Encounter (Signed)
2nd attempt, left message.

## 2024-07-27 NOTE — Telephone Encounter (Signed)
 1st attempt - VM left requesting call back  Note: Pt prescribed azithromycin  250 mg for sinusitis on 07/20/24 sig: Take 2 tablets on day 1, then 1 tablet daily on days 2 through 5.  Copied from CRM #[id number]. Topic: Clinical - Medication Question >> Jul 27, 2024  9:02 AM Berneda FALCON wrote: Reason for CRM: Patient states that the medication is causing her to be itchy, and she cannot take benadryl  because of working and it makes her sleepy. States she thinks it is the dye-no trouble breathing or any other symptoms just itchy.  Medication: This is for a Z-pack but I do not see the specific name listed in her med list.  Pharmacy: [id number] - [place], [place] - [address] AT [facility] [address] [facility] [address] [place] [address] Phone: [phone] Fax: [phone] Hours: Not open 24 hours

## 2024-07-30 NOTE — Telephone Encounter (Signed)
 Copied from CRM #[id number]. Topic: Clinical - Medication Question >> Jul 30, 2024  3:44 PM Miquel SAILOR wrote: PT returning call from12/12 from [facility] RN NT. RN Karna attempted 3 times for contact PT n/a. Called NT but PT on Hold disconnected. Attempted to call PT back n/a. Needs call back [phone]

## 2024-08-24 ENCOUNTER — Ambulatory Visit: Admitting: Family Medicine

## 2024-08-24 NOTE — Progress Notes (Unsigned)
" ° °  Acute Office Visit  Subjective:     Patient ID: Olivia Page, female    DOB: March 22, 1966, 59 y.o.   MRN: [id number]  No chief complaint on file.   HPI  Discussed the use of AI scribe software for clinical note transcription with the patient, who gave verbal consent to proceed.  History of Present Illness      ROS Per HPI      Objective:    There were no vitals taken for this visit.   Physical Exam Vitals and nursing note reviewed.  Constitutional:      General: She is not in acute distress.    Appearance: Normal appearance. She is normal weight.  HENT:     Head: Normocephalic and atraumatic.     Right Ear: External ear normal.     Left Ear: External ear normal.     Nose: Nose normal.     Mouth/Throat:     Mouth: Mucous membranes are moist.     Pharynx: Oropharynx is clear.  Eyes:     Extraocular Movements: Extraocular movements intact.     Pupils: Pupils are equal, round, and reactive to light.  Cardiovascular:     Rate and Rhythm: Normal rate and regular rhythm.     Pulses: Normal pulses.     Heart sounds: Normal heart sounds.  Pulmonary:     Effort: Pulmonary effort is normal. No respiratory distress.     Breath sounds: Normal breath sounds. No wheezing, rhonchi or rales.  Musculoskeletal:        General: Normal range of motion.     Cervical back: Normal range of motion.     Right lower leg: No edema.     Left lower leg: No edema.  Lymphadenopathy:     Cervical: No cervical adenopathy.  Neurological:     General: No focal deficit present.     Mental Status: She is alert and oriented to person, place, and time.  Psychiatric:        Mood and Affect: Mood normal.        Thought Content: Thought content normal.     No results found for any visits on 08/24/24.      Assessment & Plan:   Assessment and Plan Assessment & Plan      No orders of the defined types were placed in this encounter.    No orders of the defined types were  placed in this encounter.   No follow-ups on file.  Corean LITTIE Ku, FNP  "

## 2024-08-28 ENCOUNTER — Other Ambulatory Visit: Payer: Self-pay | Admitting: Obstetrics and Gynecology

## 2024-08-28 DIAGNOSIS — Z1231 Encounter for screening mammogram for malignant neoplasm of breast: Secondary | ICD-10-CM

## 2025-01-08 ENCOUNTER — Ambulatory Visit

## 2025-01-17 ENCOUNTER — Ambulatory Visit: Admitting: Family Medicine
